# Patient Record
Sex: Female | Born: 1960 | Race: White | Hispanic: No | Marital: Married | Smoking: Current every day smoker
Health system: Southern US, Community
[De-identification: ages and names within clinical notes are randomized; demographics above are authoritative.]

## PROBLEM LIST (undated history)

## (undated) ENCOUNTER — Emergency Department (HOSPITAL_BASED_OUTPATIENT_CLINIC_OR_DEPARTMENT_OTHER): Payer: Medicare Other | Source: Home / Self Care | Attending: Emergency Medicine | Admitting: Emergency Medicine

## (undated) DIAGNOSIS — I219 Acute myocardial infarction, unspecified: Secondary | ICD-10-CM

## (undated) DIAGNOSIS — J449 Chronic obstructive pulmonary disease, unspecified: Secondary | ICD-10-CM

## (undated) DIAGNOSIS — J9611 Chronic respiratory failure with hypoxia: Secondary | ICD-10-CM

## (undated) DIAGNOSIS — I509 Heart failure, unspecified: Secondary | ICD-10-CM

## (undated) DIAGNOSIS — F1910 Other psychoactive substance abuse, uncomplicated: Secondary | ICD-10-CM

## (undated) DIAGNOSIS — I10 Essential (primary) hypertension: Secondary | ICD-10-CM

## (undated) DIAGNOSIS — I2510 Atherosclerotic heart disease of native coronary artery without angina pectoris: Secondary | ICD-10-CM

## (undated) DIAGNOSIS — K219 Gastro-esophageal reflux disease without esophagitis: Secondary | ICD-10-CM

## (undated) HISTORY — PX: ABDOMINAL HYSTERECTOMY: SHX81

## (undated) HISTORY — PX: CHOLECYSTECTOMY: SHX55

## (undated) HISTORY — PX: CARDIAC CATHETERIZATION: SHX172

## (undated) HISTORY — PX: HX HYSTERECTOMY: SHX81

---

## 1999-03-29 ENCOUNTER — Emergency Department (HOSPITAL_COMMUNITY): Admission: EM | Admit: 1999-03-29 | Discharge: 1999-03-30 | Payer: Self-pay | Admitting: Emergency Medicine

## 1999-05-17 ENCOUNTER — Emergency Department (HOSPITAL_COMMUNITY): Admission: EM | Admit: 1999-05-17 | Discharge: 1999-05-17 | Payer: Self-pay | Admitting: Emergency Medicine

## 1999-05-18 ENCOUNTER — Encounter: Payer: Self-pay | Admitting: Emergency Medicine

## 1999-05-18 ENCOUNTER — Ambulatory Visit (HOSPITAL_COMMUNITY): Admission: RE | Admit: 1999-05-18 | Discharge: 1999-05-18 | Payer: Self-pay | Admitting: Emergency Medicine

## 1999-07-16 ENCOUNTER — Encounter: Payer: Self-pay | Admitting: Emergency Medicine

## 1999-07-16 ENCOUNTER — Emergency Department (HOSPITAL_COMMUNITY): Admission: EM | Admit: 1999-07-16 | Discharge: 1999-07-16 | Payer: Self-pay | Admitting: Emergency Medicine

## 1999-09-05 ENCOUNTER — Observation Stay (HOSPITAL_COMMUNITY): Admission: RE | Admit: 1999-09-05 | Discharge: 1999-09-06 | Payer: Self-pay

## 1999-09-30 ENCOUNTER — Emergency Department (HOSPITAL_COMMUNITY): Payer: Self-pay

## 1999-10-14 ENCOUNTER — Emergency Department (HOSPITAL_COMMUNITY): Admission: EM | Admit: 1999-10-14 | Discharge: 1999-10-14 | Payer: Self-pay | Admitting: Emergency Medicine

## 1999-11-07 ENCOUNTER — Ambulatory Visit (HOSPITAL_COMMUNITY): Admission: RE | Admit: 1999-11-07 | Discharge: 1999-11-07 | Payer: Self-pay | Admitting: Gastroenterology

## 1999-12-31 ENCOUNTER — Emergency Department (HOSPITAL_COMMUNITY): Admission: EM | Admit: 1999-12-31 | Discharge: 1999-12-31 | Payer: Self-pay | Admitting: Emergency Medicine

## 1999-12-31 ENCOUNTER — Encounter: Payer: Self-pay | Admitting: Emergency Medicine

## 2000-01-18 ENCOUNTER — Emergency Department (HOSPITAL_COMMUNITY): Admission: EM | Admit: 2000-01-18 | Discharge: 2000-01-18 | Payer: Self-pay | Admitting: Emergency Medicine

## 2018-10-28 ENCOUNTER — Other Ambulatory Visit: Payer: Self-pay | Admitting: Family Medicine

## 2018-10-28 DIAGNOSIS — Z1231 Encounter for screening mammogram for malignant neoplasm of breast: Secondary | ICD-10-CM

## 2019-10-22 ENCOUNTER — Ambulatory Visit (HOSPITAL_COMMUNITY)
Admission: EM | Admit: 2019-10-22 | Discharge: 2019-10-22 | Disposition: A | Discharge status: Home / Self Care | Payer: Medicare Other | Attending: Urgent Care | Admitting: Urgent Care

## 2019-10-22 ENCOUNTER — Ambulatory Visit (INDEPENDENT_AMBULATORY_CARE_PROVIDER_SITE_OTHER): Payer: Medicare Other

## 2019-10-22 ENCOUNTER — Encounter (HOSPITAL_COMMUNITY): Payer: Self-pay

## 2019-10-22 ENCOUNTER — Other Ambulatory Visit: Payer: Self-pay

## 2019-10-22 DIAGNOSIS — R0902 Hypoxemia: Secondary | ICD-10-CM

## 2019-10-22 DIAGNOSIS — W19XXXA Unspecified fall, initial encounter: Secondary | ICD-10-CM

## 2019-10-22 DIAGNOSIS — M25562 Pain in left knee: Secondary | ICD-10-CM

## 2019-10-22 DIAGNOSIS — M25532 Pain in left wrist: Secondary | ICD-10-CM | POA: Diagnosis not present

## 2019-10-22 DIAGNOSIS — S8002XA Contusion of left knee, initial encounter: Secondary | ICD-10-CM | POA: Diagnosis not present

## 2019-10-22 DIAGNOSIS — S60212A Contusion of left wrist, initial encounter: Secondary | ICD-10-CM

## 2019-10-22 DIAGNOSIS — J449 Chronic obstructive pulmonary disease, unspecified: Secondary | ICD-10-CM

## 2019-10-22 MED ORDER — METHYLPREDNISOLONE SODIUM SUCC 125 MG IJ SOLR
INTRAMUSCULAR | Status: AC
  Filled 2019-10-22: qty 2

## 2019-10-22 MED ORDER — METHYLPREDNISOLONE SODIUM SUCC 125 MG IJ SOLR
125.0000 mg | Freq: Once | INTRAMUSCULAR | Status: AC
  Administered 2019-10-22: 125 mg via INTRAMUSCULAR

## 2019-10-22 NOTE — ED Triage Notes (Signed)
Pt. States a month ago she fell & recently she has been sore and weak in her arms, her left knee is swollen bad.

## 2019-10-22 NOTE — Discharge Instructions (Addendum)
Please make sure you schedule follow-up with your PCP as soon as possible for recheck on your COPD.  I would emphasize the consideration for at home oxygen for your COPD.  In the meantime please make sure that you schedule your nebulized albuterol with Atrovent every 4-6 hours.  If you develop fever, chest pain, worsening shortness of breath and persistent wheezing then please report to the emergency room as this could be a COPD exacerbation.

## 2019-10-22 NOTE — ED Provider Notes (Signed)
[name]   MRN: [id number] DOB: 05-28-1961  Subjective:   Kim Greene is a 58 y.o. female presenting for recheck on suffering a fall about 1 month ago.  This is her second check for the same fall, went to [place] about 2 weeks ago.  She did have x-ray done of the left wrist and was negative.  She was given tramadol which has helped very little.  She has continued to have progressive worsening left knee pain and swelling, difficulty bearing weight, persistent left wrist pain that is like a shock type pulsation.  She has also used a cream over her left wrist with very temporary relief.  Patient has a hx of COPD, uses albuterol nebulizer treatments daily. She does not have oxygen at home.  She has not checked back with her PCP in several months out of fear of COVID-19.  She has practice social distancing.  States that the nebulizer treatments help her but she does continue to wheeze on a daily basis, does not feel like her status is changed in the past few days.  Takes APAP, albuterol nebulized, Nexium.  Has a hx of COPD, GERD.    Family History  Problem Relation Age of Onset  . Diabetes Mother   . Hypertension Mother   . Stroke Mother   . Hyperlipidemia Mother   . Lung cancer Father     Social History   Tobacco Use  . Smoking status: Current Every Day Smoker    Types: Cigarettes  . Smokeless tobacco: Never Used  Substance Use Topics  . Alcohol use: Never    Frequency: Never  . Drug use: Never    ROS   Objective:   Vitals: BP (!) 157/83 (BP Location: Left Arm)   Pulse 90   Temp 98.8 F (37.1 C) (Oral)   Resp 17   Wt 152 lb (68.9 kg)   SpO2 (!) 89%   Oxygen saturation increased to 95% on 4L of oxygen.   Physical Exam Constitutional:      General: She is not in acute distress.    Appearance: Normal appearance. She is well-developed. She is not ill-appearing, toxic-appearing or diaphoretic.  HENT:     Head: Normocephalic and atraumatic.   Nose: Nose normal.     Mouth/Throat:     Mouth: Mucous membranes are moist.  Eyes:     Extraocular Movements: Extraocular movements intact.     Pupils: Pupils are equal, round, and reactive to light.  Cardiovascular:     Rate and Rhythm: Normal rate and regular rhythm.     Pulses: Normal pulses.     Heart sounds: Normal heart sounds. No murmur. No friction rub. No gallop.   Pulmonary:     Effort: Pulmonary effort is normal. No respiratory distress.     Breath sounds: No stridor. Wheezing (mid-lower lung fields) present. No rhonchi or rales.     Comments: Patient is not having respiratory distress, no breathing through pursed lips or use of accessory muscles. Musculoskeletal:     Left wrist: She exhibits decreased range of motion, tenderness (over area depicted) and swelling (trace). She exhibits no bony tenderness, no effusion, no crepitus, no deformity and no laceration.     Left knee: She exhibits decreased range of motion, swelling (trace) and bony tenderness. She exhibits no ecchymosis, no deformity, no laceration, no erythema and normal patellar mobility. Tenderness found. Medial joint line, lateral joint line and patellar tendon tenderness noted.  Arms:  Skin:    General: Skin is warm and dry.     Findings: No rash.  Neurological:     Mental Status: She is alert and oriented to person, place, and time.  Psychiatric:        Mood and Affect: Mood normal.        Behavior: Behavior normal.        Thought Content: Thought content normal.        Judgment: Judgment normal.     Dg Knee Complete 4 Views Left  Result Date: 10/22/2019 CLINICAL DATA:  Fall 1 month ago with left knee pain. EXAM: LEFT KNEE - COMPLETE 4+ VIEW COMPARISON:  None. FINDINGS: No evidence of fracture, dislocation, or joint effusion. No evidence of arthropathy or other focal bone abnormality. Soft tissues are unremarkable. IMPRESSION: Negative. Electronically Signed   By: Elberta Fortis M.D.   On: 10/22/2019  16:11     Assessment and Plan :   1. Contusion of left knee, initial encounter   2. Fall, initial encounter   3. Hypoxemia   4. Left wrist pain   5. Acute pain of left knee   6. Chronic obstructive pulmonary disease, unspecified COPD type (HCC)   7. Contusion of left wrist, initial encounter     We will manage for contusion of her left knee but also counseled patient that there is possible soft tissue injury including meniscus, ligaments.  Recommended that she set up a follow-up soon as possible with her PCP as she may end up needing an MRI should the steroid injection not help.  I offered to apply Ace wrap to patient but she refused this, states that she will do it on her own at home.  I expect the steroid injection will help her breathing as well, emphasized need for urgent follow-up with her PCP to revisit her COPD management.  Strict ER precautions. Counseled patient on potential for adverse effects with medications prescribed today, patient verbalized understanding.    Wallis Bamberg, PA-C 10/22/19 1625

## 2021-06-11 ENCOUNTER — Inpatient Hospital Stay (HOSPITAL_COMMUNITY)
Admission: EM | Admit: 2021-06-11 | Discharge: 2021-06-16 | DRG: 177 | Disposition: A | Discharge status: Home Health | Payer: Medicare Other | Attending: Internal Medicine | Admitting: Internal Medicine

## 2021-06-11 ENCOUNTER — Emergency Department (HOSPITAL_COMMUNITY): Payer: Medicare Other

## 2021-06-11 ENCOUNTER — Encounter (HOSPITAL_COMMUNITY): Payer: Self-pay

## 2021-06-11 DIAGNOSIS — Z823 Family history of stroke: Secondary | ICD-10-CM

## 2021-06-11 DIAGNOSIS — E785 Hyperlipidemia, unspecified: Secondary | ICD-10-CM | POA: Diagnosis present

## 2021-06-11 DIAGNOSIS — F32A Depression, unspecified: Secondary | ICD-10-CM | POA: Diagnosis present

## 2021-06-11 DIAGNOSIS — D7589 Other specified diseases of blood and blood-forming organs: Secondary | ICD-10-CM

## 2021-06-11 DIAGNOSIS — K219 Gastro-esophageal reflux disease without esophagitis: Secondary | ICD-10-CM | POA: Diagnosis present

## 2021-06-11 DIAGNOSIS — Z8249 Family history of ischemic heart disease and other diseases of the circulatory system: Secondary | ICD-10-CM

## 2021-06-11 DIAGNOSIS — R52 Pain, unspecified: Secondary | ICD-10-CM

## 2021-06-11 DIAGNOSIS — Z7901 Long term (current) use of anticoagulants: Secondary | ICD-10-CM

## 2021-06-11 DIAGNOSIS — Z833 Family history of diabetes mellitus: Secondary | ICD-10-CM

## 2021-06-11 DIAGNOSIS — E876 Hypokalemia: Secondary | ICD-10-CM

## 2021-06-11 DIAGNOSIS — S301XXA Contusion of abdominal wall, initial encounter: Secondary | ICD-10-CM | POA: Diagnosis present

## 2021-06-11 DIAGNOSIS — U071 COVID-19: Principal | ICD-10-CM

## 2021-06-11 DIAGNOSIS — J1282 Pneumonia due to coronavirus disease 2019: Secondary | ICD-10-CM

## 2021-06-11 DIAGNOSIS — Z66 Do not resuscitate: Secondary | ICD-10-CM | POA: Diagnosis present

## 2021-06-11 DIAGNOSIS — M7989 Other specified soft tissue disorders: Secondary | ICD-10-CM

## 2021-06-11 DIAGNOSIS — Z801 Family history of malignant neoplasm of trachea, bronchus and lung: Secondary | ICD-10-CM

## 2021-06-11 DIAGNOSIS — G8929 Other chronic pain: Secondary | ICD-10-CM | POA: Diagnosis present

## 2021-06-11 DIAGNOSIS — J449 Chronic obstructive pulmonary disease, unspecified: Secondary | ICD-10-CM | POA: Diagnosis present

## 2021-06-11 DIAGNOSIS — T148XXA Other injury of unspecified body region, initial encounter: Secondary | ICD-10-CM

## 2021-06-11 DIAGNOSIS — F17210 Nicotine dependence, cigarettes, uncomplicated: Secondary | ICD-10-CM | POA: Diagnosis present

## 2021-06-11 DIAGNOSIS — G9349 Other encephalopathy: Secondary | ICD-10-CM | POA: Diagnosis present

## 2021-06-11 DIAGNOSIS — F909 Attention-deficit hyperactivity disorder, unspecified type: Secondary | ICD-10-CM | POA: Diagnosis present

## 2021-06-11 DIAGNOSIS — J9621 Acute and chronic respiratory failure with hypoxia: Secondary | ICD-10-CM | POA: Diagnosis present

## 2021-06-11 DIAGNOSIS — S8012XA Contusion of left lower leg, initial encounter: Secondary | ICD-10-CM | POA: Diagnosis present

## 2021-06-11 DIAGNOSIS — Z83438 Family history of other disorder of lipoprotein metabolism and other lipidemia: Secondary | ICD-10-CM

## 2021-06-11 DIAGNOSIS — Z7983 Long term (current) use of bisphosphonates: Secondary | ICD-10-CM

## 2021-06-11 DIAGNOSIS — Z9981 Dependence on supplemental oxygen: Secondary | ICD-10-CM

## 2021-06-11 DIAGNOSIS — Z79899 Other long term (current) drug therapy: Secondary | ICD-10-CM

## 2021-06-11 DIAGNOSIS — I2510 Atherosclerotic heart disease of native coronary artery without angina pectoris: Secondary | ICD-10-CM

## 2021-06-11 DIAGNOSIS — R55 Syncope and collapse: Secondary | ICD-10-CM | POA: Diagnosis present

## 2021-06-11 DIAGNOSIS — R0902 Hypoxemia: Secondary | ICD-10-CM

## 2021-06-11 DIAGNOSIS — J440 Chronic obstructive pulmonary disease with acute lower respiratory infection: Secondary | ICD-10-CM | POA: Diagnosis present

## 2021-06-11 DIAGNOSIS — Z9119 Patient's noncompliance with other medical treatment and regimen: Secondary | ICD-10-CM

## 2021-06-11 HISTORY — DX: Atherosclerotic heart disease of native coronary artery without angina pectoris: I25.10

## 2021-06-11 HISTORY — DX: Gastro-esophageal reflux disease without esophagitis: K21.9

## 2021-06-11 LAB — CBG MONITORING, ED: Glucose-Capillary: 90 mg/dL (ref 70–99)

## 2021-06-11 NOTE — ED Provider Notes (Signed)
Emergency Medicine Provider Triage Evaluation Note  Kim Greene , a 60 y.o. female  was evaluated in triage.  Pt brought to check-in desk by son who reports she is having a stroke.  He is no longer in the lobby for me to discuss with.  Patient unable to tell me why she is here.  She gives an incorrect name, she does not know where she is or what the date is.  She does give me a correct birthdate.  Level 5 caveat due to altered mental status.  Discussed with son who states that she awoke this morning as normal but has become progressively confused throughout the day.  She does take gabapentin, Lyrica and Suboxone.  He denies possibility of accidental overdose.  Denies any alcohol usage today.  Reports she did have a heart attack 2 weeks ago and was treated in [place].  Denies falls or known trauma.  She is on a blood thinner.  Also concerned about bruising to her abdomen and left leg.  Review of Systems  Positive: Unable to obtain due to confusion Negative: Unable to obtain due to confusion  Physical Exam  BP 125/72 (BP Location: Right Arm)   Pulse 88   Resp 16   SpO2 (!) 78%  Gen:   Awake, no distress, appears sleepy Resp:  Normal effort, congested cough, rales MSK:   Moves extremities without difficulty  Other:  confused  Medical Decision Making  Medically screening exam initiated at 1:24 AM.  Appropriate orders placed.  Kim Greene was informed that the remainder of the evaluation will be completed by another provider, this initial triage assessment does not replace that evaluation, and the importance of remaining in the ED until their evaluation is complete.  Altered mental status of unknown origin.   1:25 AM Vitals just now placed into chart after multiple requests.  Pt profoundly hypoxic - likely the cause of her AMS.    Rechecked by myself - 74% on room air.  Pt placed on O2 and will be moved to the next room.   Kim Greene, Kim Greene 06/12/21 0129     Kim Octave, MD 06/12/21 [phone]

## 2021-06-11 NOTE — ED Triage Notes (Signed)
Pt comes in for AMS, does not know who brought her here or why she is here, no neuro deficits.

## 2021-06-12 ENCOUNTER — Emergency Department (HOSPITAL_COMMUNITY): Payer: Medicare Other

## 2021-06-12 ENCOUNTER — Inpatient Hospital Stay (HOSPITAL_COMMUNITY): Payer: Medicare Other

## 2021-06-12 ENCOUNTER — Encounter (HOSPITAL_COMMUNITY): Payer: Self-pay | Admitting: Internal Medicine

## 2021-06-12 ENCOUNTER — Encounter (HOSPITAL_COMMUNITY): Payer: Medicare Other

## 2021-06-12 DIAGNOSIS — M7989 Other specified soft tissue disorders: Secondary | ICD-10-CM | POA: Diagnosis not present

## 2021-06-12 DIAGNOSIS — S301XXA Contusion of abdominal wall, initial encounter: Secondary | ICD-10-CM | POA: Diagnosis present

## 2021-06-12 DIAGNOSIS — G9349 Other encephalopathy: Secondary | ICD-10-CM | POA: Diagnosis present

## 2021-06-12 DIAGNOSIS — R55 Syncope and collapse: Secondary | ICD-10-CM | POA: Diagnosis present

## 2021-06-12 DIAGNOSIS — J9601 Acute respiratory failure with hypoxia: Secondary | ICD-10-CM

## 2021-06-12 DIAGNOSIS — F32A Depression, unspecified: Secondary | ICD-10-CM | POA: Diagnosis present

## 2021-06-12 DIAGNOSIS — E785 Hyperlipidemia, unspecified: Secondary | ICD-10-CM | POA: Diagnosis present

## 2021-06-12 DIAGNOSIS — J449 Chronic obstructive pulmonary disease, unspecified: Secondary | ICD-10-CM | POA: Diagnosis present

## 2021-06-12 DIAGNOSIS — Z801 Family history of malignant neoplasm of trachea, bronchus and lung: Secondary | ICD-10-CM | POA: Diagnosis not present

## 2021-06-12 DIAGNOSIS — Z8249 Family history of ischemic heart disease and other diseases of the circulatory system: Secondary | ICD-10-CM | POA: Diagnosis not present

## 2021-06-12 DIAGNOSIS — Z833 Family history of diabetes mellitus: Secondary | ICD-10-CM | POA: Diagnosis not present

## 2021-06-12 DIAGNOSIS — Z7901 Long term (current) use of anticoagulants: Secondary | ICD-10-CM | POA: Diagnosis not present

## 2021-06-12 DIAGNOSIS — Z9119 Patient's noncompliance with other medical treatment and regimen: Secondary | ICD-10-CM | POA: Diagnosis not present

## 2021-06-12 DIAGNOSIS — E876 Hypokalemia: Secondary | ICD-10-CM

## 2021-06-12 DIAGNOSIS — F17210 Nicotine dependence, cigarettes, uncomplicated: Secondary | ICD-10-CM | POA: Diagnosis present

## 2021-06-12 DIAGNOSIS — D7589 Other specified diseases of blood and blood-forming organs: Secondary | ICD-10-CM

## 2021-06-12 DIAGNOSIS — Z83438 Family history of other disorder of lipoprotein metabolism and other lipidemia: Secondary | ICD-10-CM | POA: Diagnosis not present

## 2021-06-12 DIAGNOSIS — K219 Gastro-esophageal reflux disease without esophagitis: Secondary | ICD-10-CM | POA: Diagnosis present

## 2021-06-12 DIAGNOSIS — I2510 Atherosclerotic heart disease of native coronary artery without angina pectoris: Secondary | ICD-10-CM

## 2021-06-12 DIAGNOSIS — Z7983 Long term (current) use of bisphosphonates: Secondary | ICD-10-CM | POA: Diagnosis not present

## 2021-06-12 DIAGNOSIS — Z823 Family history of stroke: Secondary | ICD-10-CM | POA: Diagnosis not present

## 2021-06-12 DIAGNOSIS — J9621 Acute and chronic respiratory failure with hypoxia: Secondary | ICD-10-CM | POA: Diagnosis present

## 2021-06-12 DIAGNOSIS — U071 COVID-19: Secondary | ICD-10-CM

## 2021-06-12 DIAGNOSIS — Z9981 Dependence on supplemental oxygen: Secondary | ICD-10-CM | POA: Diagnosis not present

## 2021-06-12 DIAGNOSIS — J1282 Pneumonia due to coronavirus disease 2019: Secondary | ICD-10-CM

## 2021-06-12 DIAGNOSIS — Z66 Do not resuscitate: Secondary | ICD-10-CM | POA: Diagnosis present

## 2021-06-12 DIAGNOSIS — J440 Chronic obstructive pulmonary disease with acute lower respiratory infection: Secondary | ICD-10-CM | POA: Diagnosis present

## 2021-06-12 LAB — CBC WITH DIFFERENTIAL/PLATELET
Abs Immature Granulocytes: 0.05 10*3/uL (ref 0.00–0.07)
Basophils Absolute: 0 10*3/uL (ref 0.0–0.1)
Basophils Relative: 0 %
Eosinophils Absolute: 0.1 10*3/uL (ref 0.0–0.5)
Eosinophils Relative: 2 %
HCT: 43 % (ref 36.0–46.0)
Hemoglobin: 14 g/dL (ref 12.0–15.0)
Immature Granulocytes: 1 %
Lymphocytes Relative: 12 %
Lymphs Abs: 1 10*3/uL (ref 0.7–4.0)
MCH: 33.6 pg (ref 26.0–34.0)
MCHC: 32.6 g/dL (ref 30.0–36.0)
MCV: 103.1 fL — ABNORMAL HIGH (ref 80.0–100.0)
Monocytes Absolute: 0.8 10*3/uL (ref 0.1–1.0)
Monocytes Relative: 10 %
Neutro Abs: 6 10*3/uL (ref 1.7–7.7)
Neutrophils Relative %: 75 %
Platelets: 126 10*3/uL — ABNORMAL LOW (ref 150–400)
RBC: 4.17 MIL/uL (ref 3.87–5.11)
RDW: 13.8 % (ref 11.5–15.5)
WBC: 8 10*3/uL (ref 4.0–10.5)
nRBC: 0 % (ref 0.0–0.2)

## 2021-06-12 LAB — URINALYSIS, COMPLETE (UACMP) WITH MICROSCOPIC
Bilirubin Urine: NEGATIVE
Glucose, UA: NEGATIVE mg/dL
Hgb urine dipstick: NEGATIVE
Ketones, ur: NEGATIVE mg/dL
Leukocytes,Ua: NEGATIVE
Nitrite: NEGATIVE
Protein, ur: 100 mg/dL — AB
Specific Gravity, Urine: 1.018 (ref 1.005–1.030)
pH: 7 (ref 5.0–8.0)

## 2021-06-12 LAB — I-STAT ARTERIAL BLOOD GAS, ED
Acid-Base Excess: 9 mmol/L — ABNORMAL HIGH (ref 0.0–2.0)
Bicarbonate: 34.2 mmol/L — ABNORMAL HIGH (ref 20.0–28.0)
Calcium, Ion: 0.94 mmol/L — ABNORMAL LOW (ref 1.15–1.40)
HCT: 38 % (ref 36.0–46.0)
Hemoglobin: 12.9 g/dL (ref 12.0–15.0)
O2 Saturation: 100 %
Patient temperature: 101.7
Potassium: 3.4 mmol/L — ABNORMAL LOW (ref 3.5–5.1)
Sodium: 136 mmol/L (ref 135–145)
TCO2: 36 mmol/L — ABNORMAL HIGH (ref 22–32)
pCO2 arterial: 52.7 mmHg — ABNORMAL HIGH (ref 32.0–48.0)
pH, Arterial: 7.427 (ref 7.350–7.450)
pO2, Arterial: 293 mmHg — ABNORMAL HIGH (ref 83.0–108.0)

## 2021-06-12 LAB — PROCALCITONIN: Procalcitonin: 0.2 ng/mL

## 2021-06-12 LAB — COMPREHENSIVE METABOLIC PANEL
ALT: 17 U/L (ref 0–44)
AST: 18 U/L (ref 15–41)
Albumin: 3.1 g/dL — ABNORMAL LOW (ref 3.5–5.0)
Alkaline Phosphatase: 73 U/L (ref 38–126)
Anion gap: 10 (ref 5–15)
BUN: 8 mg/dL (ref 6–20)
CO2: 29 mmol/L (ref 22–32)
Calcium: 7.2 mg/dL — ABNORMAL LOW (ref 8.9–10.3)
Chloride: 98 mmol/L (ref 98–111)
Creatinine, Ser: 0.82 mg/dL (ref 0.44–1.00)
GFR, Estimated: >60 mL/min (ref >60)
Glucose, Bld: 90 mg/dL (ref 70–99)
Potassium: 3.5 mmol/L (ref 3.5–5.1)
Sodium: 137 mmol/L (ref 135–145)
Total Bilirubin: 1 mg/dL (ref 0.3–1.2)
Total Protein: 6.4 g/dL — ABNORMAL LOW (ref 6.5–8.1)

## 2021-06-12 LAB — RAPID URINE DRUG SCREEN, HOSP PERFORMED
Amphetamines: POSITIVE — AB
Barbiturates: POSITIVE — AB
Benzodiazepines: POSITIVE — AB
Cocaine: NOT DETECTED
Opiates: NOT DETECTED
Tetrahydrocannabinol: NOT DETECTED

## 2021-06-12 LAB — CREATININE, SERUM
Creatinine, Ser: 0.71 mg/dL (ref 0.44–1.00)
GFR, Estimated: >60 mL/min (ref >60)

## 2021-06-12 LAB — C-REACTIVE PROTEIN: CRP: 13.6 mg/dL — ABNORMAL HIGH (ref <1.0)

## 2021-06-12 LAB — IRON AND TIBC
Iron: 19 ug/dL — ABNORMAL LOW (ref 28–170)
Saturation Ratios: 6 % — ABNORMAL LOW (ref 10.4–31.8)
TIBC: 305 ug/dL (ref 250–450)
UIBC: 286 ug/dL

## 2021-06-12 LAB — TROPONIN I (HIGH SENSITIVITY)
Troponin I (High Sensitivity): 20 ng/L — ABNORMAL HIGH (ref <18)
Troponin I (High Sensitivity): 10 ng/L (ref <18)

## 2021-06-12 LAB — FOLATE: Folate: 8.5 ng/mL (ref >5.9)

## 2021-06-12 LAB — CBC
HCT: 38.5 % (ref 36.0–46.0)
Hemoglobin: 12.8 g/dL (ref 12.0–15.0)
MCH: 34 pg (ref 26.0–34.0)
MCHC: 33.2 g/dL (ref 30.0–36.0)
MCV: 102.1 fL — ABNORMAL HIGH (ref 80.0–100.0)
Platelets: 113 10*3/uL — ABNORMAL LOW (ref 150–400)
RBC: 3.77 MIL/uL — ABNORMAL LOW (ref 3.87–5.11)
RDW: 13.7 % (ref 11.5–15.5)
WBC: 8.7 10*3/uL (ref 4.0–10.5)
nRBC: 0 % (ref 0.0–0.2)

## 2021-06-12 LAB — FERRITIN: Ferritin: 106 ng/mL (ref 11–307)

## 2021-06-12 LAB — BRAIN NATRIURETIC PEPTIDE: B Natriuretic Peptide: 93.9 pg/mL (ref 0.0–100.0)

## 2021-06-12 LAB — D-DIMER, QUANTITATIVE: D-Dimer, Quant: 1.62 ug/mL-FEU — ABNORMAL HIGH (ref 0.00–0.50)

## 2021-06-12 LAB — ETHANOL: Alcohol, Ethyl (B): <10 mg/dL (ref <10)

## 2021-06-12 LAB — HIV ANTIBODY (ROUTINE TESTING W REFLEX): HIV Screen 4th Generation wRfx: NONREACTIVE

## 2021-06-12 LAB — LACTIC ACID, PLASMA: Lactic Acid, Venous: 1.2 mmol/L (ref 0.5–1.9)

## 2021-06-12 LAB — RESP PANEL BY RT-PCR (FLU A&B, COVID) ARPGX2
Influenza A by PCR: NEGATIVE
Influenza B by PCR: NEGATIVE
SARS Coronavirus 2 by RT PCR: POSITIVE — AB

## 2021-06-12 LAB — VITAMIN B12: Vitamin B-12: 86 pg/mL — ABNORMAL LOW (ref 180–914)

## 2021-06-12 LAB — AMMONIA: Ammonia: 33 umol/L (ref 9–35)

## 2021-06-12 MED ORDER — ENOXAPARIN SODIUM 40 MG/0.4ML IJ SOSY
40.0000 mg | PREFILLED_SYRINGE | INTRAMUSCULAR | Status: DC
  Administered 2021-06-12: 40 mg via SUBCUTANEOUS
  Filled 2021-06-12: qty 0.4

## 2021-06-12 MED ORDER — FLUTICASONE FUROATE-VILANTEROL 100-25 MCG/INH IN AEPB
1.0000 | INHALATION_SPRAY | Freq: Every day | RESPIRATORY_TRACT | Status: DC
  Administered 2021-06-12 – 2021-06-16 (×5): 1 via RESPIRATORY_TRACT
  Filled 2021-06-12: qty 28

## 2021-06-12 MED ORDER — DEXAMETHASONE SODIUM PHOSPHATE 10 MG/ML IJ SOLN
6.0000 mg | Freq: Once | INTRAMUSCULAR | Status: AC
  Administered 2021-06-12: 6 mg via INTRAVENOUS
  Filled 2021-06-12: qty 1

## 2021-06-12 MED ORDER — ACETAMINOPHEN 325 MG PO TABS: 650.0000 mg | ORAL_TABLET | Freq: Four times a day (QID) | ORAL | Status: DC | PRN

## 2021-06-12 MED ORDER — PREGABALIN 100 MG PO CAPS
200.0000 mg | ORAL_CAPSULE | Freq: Three times a day (TID) | ORAL | Status: DC
  Administered 2021-06-12 – 2021-06-16 (×13): 200 mg via ORAL
  Filled 2021-06-12 (×13): qty 2

## 2021-06-12 MED ORDER — SODIUM CHLORIDE 0.9 % IV SOLN
200.0000 mg | Freq: Once | INTRAVENOUS | Status: AC
  Administered 2021-06-12: 200 mg via INTRAVENOUS
  Filled 2021-06-12: qty 40

## 2021-06-12 MED ORDER — FLUTICASONE-UMECLIDIN-VILANT 100-62.5-25 MCG/INH IN AEPB: 1.0000 | INHALATION_SPRAY | Freq: Every day | RESPIRATORY_TRACT | Status: DC

## 2021-06-12 MED ORDER — CLOPIDOGREL BISULFATE 75 MG PO TABS
75.0000 mg | ORAL_TABLET | Freq: Every day | ORAL | Status: DC
  Administered 2021-06-12 – 2021-06-16 (×5): 75 mg via ORAL
  Filled 2021-06-12 (×6): qty 1

## 2021-06-12 MED ORDER — ATORVASTATIN CALCIUM 40 MG PO TABS
40.0000 mg | ORAL_TABLET | Freq: Every day | ORAL | Status: DC
  Administered 2021-06-12 – 2021-06-15 (×4): 40 mg via ORAL
  Filled 2021-06-12 (×4): qty 1

## 2021-06-12 MED ORDER — SODIUM CHLORIDE 0.9 % IV SOLN
100.0000 mg | Freq: Every day | INTRAVENOUS | Status: AC
Start: 2021-06-13 — End: 2021-06-16
  Administered 2021-06-13 – 2021-06-16 (×4): 100 mg via INTRAVENOUS
  Filled 2021-06-12 (×4): qty 20

## 2021-06-12 MED ORDER — METHYLPREDNISOLONE SODIUM SUCC 125 MG IJ SOLR
60.0000 mg | Freq: Two times a day (BID) | INTRAMUSCULAR | Status: DC
  Administered 2021-06-12 – 2021-06-16 (×9): 60 mg via INTRAVENOUS
  Filled 2021-06-12 (×9): qty 2

## 2021-06-12 MED ORDER — DEXAMETHASONE SODIUM PHOSPHATE 10 MG/ML IJ SOLN
6.0000 mg | INTRAMUSCULAR | Status: DC
Start: 2021-06-13 — End: 2021-06-12

## 2021-06-12 MED ORDER — ACETAMINOPHEN 650 MG RE SUPP: 650.0000 mg | Freq: Four times a day (QID) | RECTAL | Status: DC | PRN

## 2021-06-12 MED ORDER — ASPIRIN EC 81 MG PO TBEC
81.0000 mg | DELAYED_RELEASE_TABLET | Freq: Every day | ORAL | Status: DC
  Administered 2021-06-12 – 2021-06-16 (×5): 81 mg via ORAL
  Filled 2021-06-12 (×5): qty 1

## 2021-06-12 MED ORDER — UMECLIDINIUM BROMIDE 62.5 MCG/INH IN AEPB
1.0000 | INHALATION_SPRAY | Freq: Every day | RESPIRATORY_TRACT | Status: DC
  Administered 2021-06-12 – 2021-06-16 (×5): 1 via RESPIRATORY_TRACT
  Filled 2021-06-12: qty 7

## 2021-06-12 NOTE — Plan of Care (Signed)
None

## 2021-06-12 NOTE — ED Notes (Signed)
Pt continues to remove monitoring and oxygen. Pt allowed this RN to place pulse ox and oxygen back on pt.

## 2021-06-12 NOTE — Assessment & Plan Note (Signed)
-   On 2 L chronically at home but has been noncompliant recently - Continues to smoke, 1-2 PPD/day -Continue O2 and wean as able -Acute worsening considered due to underlying COVID infection as well as probable COPD exacerbation

## 2021-06-12 NOTE — Assessment & Plan Note (Signed)
continue lipitor ?

## 2021-06-12 NOTE — Progress Notes (Signed)
Progress Note    Kim [facility]   ZOX:[id number]  DOB: 1961-10-26  DOA: 06/11/2021     0  PCP: Patient, No Pcp Per (Inactive)  CC: SOB, fever, cough  [facility] Course: [facility] is a 60 yo female with PMH COPD, chronic pain and also on Suboxone was [facility] at [facility] [place] what looks like could be an NSTEMI when patient underwent cardiac cath and was told to take aspirin Plavix and statins which are new at the time patient also was placed on Z-Pak and steroids was discharged about 4 days PTA, she had come to [facility] to help her daughter to move back to her CT.   Evening prior to admission, she went with her son for shopping at [employer] patient appeared confused and was having some strange smell and later on passed out on the floor.  She did regain consciousness soon but was brought to the ER.  Did not complain of any chest pain but did have shortness of breath.   After her discharge from the [facility] 4 days ago she was found to have bruising on the left lower extremity and left lower quadrant of the abdomen.  Denies any abdominal pain.  Patient usually takes Vyvanse but [facility] doctor at discharge advised her to stop taking Vyvanse due to her cardiac issues she had at the [facility].   ED Course: In the ER patient was confused initially requiring 100% nonrebreather presently on 5 L.  Chest x-ray shows bilateral infiltrates COVID test came back positive patient also was febrile with temperature of 101.7.  CT head is unremarkable.  EKG shows normal sinus rhythm with nonspecific ST-T changes.  Patient is started on Decadron remdesivir admitted for acute hypoxic respiratory failure secondary to COVID infection. She confirmed with her son present in the ER that she wished to be a DNR/DNI.  Interval History:  Seen in the ER this morning.  Her son was present bedside. We discussed CODE STATUS and she elected for DNR/DNI as  well.   ROS: Constitutional: positive for fatigue, fevers, and malaise, negative for night sweats, Respiratory: positive for cough, dyspnea on exertion, sputum, and wheezing, negative for pleurisy/chest pain, Cardiovascular: negative for chest pain, and Gastrointestinal: negative for abdominal pain  Assessment & Plan: * Pneumonia due to COVID-19 virus - At risk for further decompensation with underlying newly diagnosed CAD.  Awaiting records from [facility] -Continue remdesivir and steroids - CODE STATUS confirmed in the ER, she elects for DNR/DNI, her son was bedside as witness for this conversation -Change Decadron to Solu-Medrol - Check inflammatory markers daily - Encourage incentive spirometer, flutter, and self proning  Acute on chronic respiratory failure with hypoxia (HCC) - On 2 L chronically at home but has been noncompliant recently - Continues to smoke, 1-2 PPD/day -Continue O2 and wean as able -Acute worsening considered due to underlying COVID infection as well as probable COPD exacerbation  CAD (coronary artery disease) - Records requested from [facility] where she was there recently for possible NSTEMI - Continue aspirin, Plavix, statin  Macrocytosis without anemia - Check iron studies and folate, B12  Hypokalemia - Will replete and recheck as needed  COPD (chronic obstructive pulmonary disease) (HCC) - continue steroids and breathing treatments   HLD (hyperlipidemia) - continue lipitor     Old records reviewed in assessment of this patient  Antimicrobials: Remdesivir 7/26 >> current  DVT prophylaxis: enoxaparin (LOVENOX) injection 40 mg Start: 06/12/21 [id number]  Code Status:   Code Status: DNR Family Communication: son  Disposition Plan: Status is: Inpatient  Remains inpatient appropriate because:IV treatments appropriate due to intensity of illness or inability to take PO and Inpatient level of care appropriate due to severity of  illness  Dispo: The patient is from: Home              Anticipated d/c is to: Home              Patient currently is not medically stable to d/c.   Difficult to place patient No  Risk of unplanned readmission score: Unplanned Admission- Pilot do not use: 12.84   Objective: Blood pressure 111/74, pulse 61, temperature 99.2 F (37.3 C), temperature source Temporal, resp. rate 16, SpO2 92 %.  Examination: General appearance: alert, cooperative, fatigued, and no distress Head: Normocephalic, without obvious abnormality, atraumatic Eyes:  EOMI Lungs:  Decreased air movement bilaterally, expiratory wheezing bilaterally, scattered rhonchi Heart: regular rate and rhythm and S1, S2 normal Abdomen: Lower abdominal bruising appreciated approximately 10 cm long by 5 cm wide.  Bowel sounds present, soft, nontender Extremities:  Bruising noted throughout entire left leg up to knee with associated 1+ edema Skin:  Bruising noted on abdomen and left leg Neurologic: Grossly normal  Consultants:    Procedures:    Data Reviewed: I have personally reviewed following labs and imaging studies Results for orders placed or performed during the [facility] encounter of 06/11/21 (from the past 24 hour(s))  CBG monitoring, ED     Status: None   Collection Time: 06/11/21 11:47 PM  Result Value Ref Range   Glucose-Capillary 90 70 - 99 mg/dL  Comprehensive metabolic panel     Status: Abnormal   Collection Time: 06/12/21 12:03 AM  Result Value Ref Range   Sodium 137 135 - 145 mmol/L   Potassium 3.5 3.5 - 5.1 mmol/L   Chloride 98 98 - 111 mmol/L   CO2 29 22 - 32 mmol/L   Glucose, Bld 90 70 - 99 mg/dL   BUN 8 6 - 20 mg/dL   Creatinine, Ser 6.31 0.44 - 1.00 mg/dL   Calcium 7.2 (L) 8.9 - 10.3 mg/dL   Total Protein 6.4 (L) 6.5 - 8.1 g/dL   Albumin 3.1 (L) 3.5 - 5.0 g/dL   AST 18 15 - 41 U/L   ALT 17 0 - 44 U/L   Alkaline Phosphatase 73 38 - 126 U/L   Total Bilirubin 1.0 0.3 - 1.2 mg/dL   GFR, Estimated  >49 >70 mL/min   Anion gap 10 5 - 15  CBC WITH DIFFERENTIAL     Status: Abnormal   Collection Time: 06/12/21 12:03 AM  Result Value Ref Range   WBC 8.0 4.0 - 10.5 K/uL   RBC 4.17 3.87 - 5.11 MIL/uL   Hemoglobin 14.0 12.0 - 15.0 g/dL   HCT 26.3 78.5 - 88.5 %   MCV 103.1 (H) 80.0 - 100.0 fL   MCH 33.6 26.0 - 34.0 pg   MCHC 32.6 30.0 - 36.0 g/dL   RDW 02.7 74.1 - 28.7 %   Platelets 126 (L) 150 - 400 K/uL   nRBC 0.0 0.0 - 0.2 %   Neutrophils Relative % 75 %   Neutro Abs 6.0 1.7 - 7.7 K/uL   Lymphocytes Relative 12 %   Lymphs Abs 1.0 0.7 - 4.0 K/uL   Monocytes Relative 10 %   Monocytes Absolute 0.8 0.1 - 1.0 K/uL   Eosinophils Relative 2 %  Eosinophils Absolute 0.1 0.0 - 0.5 K/uL   Basophils Relative 0 %   Basophils Absolute 0.0 0.0 - 0.1 K/uL   Immature Granulocytes 1 %   Abs Immature Granulocytes 0.05 0.00 - 0.07 K/uL  Ammonia     Status: None   Collection Time: 06/12/21 12:03 AM  Result Value Ref Range   Ammonia 33 9 - 35 umol/L  Lactic acid, plasma     Status: None   Collection Time: 06/12/21 12:03 AM  Result Value Ref Range   Lactic Acid, Venous 1.2 0.5 - 1.9 mmol/L  Ethanol     Status: None   Collection Time: 06/12/21 12:04 AM  Result Value Ref Range   Alcohol, Ethyl (B) <10 <10 mg/dL  Brain natriuretic peptide     Status: None   Collection Time: 06/12/21 12:40 AM  Result Value Ref Range   B Natriuretic Peptide 93.9 0.0 - 100.0 pg/mL  I-Stat arterial blood gas, ED     Status: Abnormal   Collection Time: 06/12/21  2:09 AM  Result Value Ref Range   pH, Arterial 7.427 7.350 - 7.450   pCO2 arterial 52.7 (H) 32.0 - 48.0 mmHg   pO2, Arterial 293 (H) 83.0 - 108.0 mmHg   Bicarbonate 34.2 (H) 20.0 - 28.0 mmol/L   TCO2 36 (H) 22 - 32 mmol/L   O2 Saturation 100.0 %   Acid-Base Excess 9.0 (H) 0.0 - 2.0 mmol/L   Sodium 136 135 - 145 mmol/L   Potassium 3.4 (L) 3.5 - 5.1 mmol/L   Calcium, Ion 0.94 (L) 1.15 - 1.40 mmol/L   HCT 38.0 36.0 - 46.0 %   Hemoglobin 12.9 12.0 -  15.0 g/dL   Patient temperature 867.5 F    Collection site Radial    Drawn by RT    Sample type ARTERIAL   Resp Panel by RT-PCR (Flu A&B, Covid) Nasopharyngeal Swab     Status: Abnormal   Collection Time: 06/12/21  2:21 AM   Specimen: Nasopharyngeal Swab; Nasopharyngeal(NP) swabs in vial transport medium  Result Value Ref Range   SARS Coronavirus 2 by RT PCR POSITIVE (A) NEGATIVE   Influenza A by PCR NEGATIVE NEGATIVE   Influenza B by PCR NEGATIVE NEGATIVE  Urinalysis, Complete w Microscopic Urine, Clean Catch     Status: Abnormal   Collection Time: 06/12/21  3:40 AM  Result Value Ref Range   Color, Urine YELLOW YELLOW   APPearance HAZY (A) CLEAR   Specific Gravity, Urine 1.018 1.005 - 1.030   pH 7.0 5.0 - 8.0   Glucose, UA NEGATIVE NEGATIVE mg/dL   Hgb urine dipstick NEGATIVE NEGATIVE   Bilirubin Urine NEGATIVE NEGATIVE   Ketones, ur NEGATIVE NEGATIVE mg/dL   Protein, ur 449 (A) NEGATIVE mg/dL   Nitrite NEGATIVE NEGATIVE   Leukocytes,Ua NEGATIVE NEGATIVE   RBC / HPF 0-5 0 - 5 RBC/hpf   WBC, UA 0-5 0 - 5 WBC/hpf   Bacteria, UA RARE (A) NONE SEEN   Squamous Epithelial / LPF 6-10 0 - 5  Urine rapid drug screen (hosp performed)     Status: Abnormal   Collection Time: 06/12/21  3:41 AM  Result Value Ref Range   Opiates NONE DETECTED NONE DETECTED   Cocaine NONE DETECTED NONE DETECTED   Benzodiazepines POSITIVE (A) NONE DETECTED   Amphetamines POSITIVE (A) NONE DETECTED   Tetrahydrocannabinol NONE DETECTED NONE DETECTED   Barbiturates POSITIVE (A) NONE DETECTED  HIV Antibody (routine testing w rflx)     Status: None  Collection Time: 06/12/21  8:10 AM  Result Value Ref Range   HIV Screen 4th Generation wRfx Non Reactive Non Reactive  CBC     Status: Abnormal   Collection Time: 06/12/21  8:10 AM  Result Value Ref Range   WBC 8.7 4.0 - 10.5 K/uL   RBC 3.77 (L) 3.87 - 5.11 MIL/uL   Hemoglobin 12.8 12.0 - 15.0 g/dL   HCT 34.1 93.7 - 90.2 %   MCV 102.1 (H) 80.0 - 100.0 fL    MCH 34.0 26.0 - 34.0 pg   MCHC 33.2 30.0 - 36.0 g/dL   RDW 40.9 73.5 - 32.9 %   Platelets 113 (L) 150 - 400 K/uL   nRBC 0.0 0.0 - 0.2 %  Creatinine, serum     Status: None   Collection Time: 06/12/21  8:10 AM  Result Value Ref Range   Creatinine, Ser 0.71 0.44 - 1.00 mg/dL   GFR, Estimated >92 >42 mL/min  D-dimer, quantitative     Status: Abnormal   Collection Time: 06/12/21  8:10 AM  Result Value Ref Range   D-Dimer, Quant 1.62 (H) 0.00 - 0.50 ug/mL-FEU  Procalcitonin     Status: None   Collection Time: 06/12/21  8:10 AM  Result Value Ref Range   Procalcitonin 0.20 ng/mL  Troponin I (High Sensitivity)     Status: Abnormal   Collection Time: 06/12/21  8:10 AM  Result Value Ref Range   Troponin I (High Sensitivity) 20 (H) <18 ng/L    Recent Results (from the past 240 hour(s))  Resp Panel by RT-PCR (Flu A&B, Covid) Nasopharyngeal Swab     Status: Abnormal   Collection Time: 06/12/21  2:21 AM   Specimen: Nasopharyngeal Swab; Nasopharyngeal(NP) swabs in vial transport medium  Result Value Ref Range Status   SARS Coronavirus 2 by RT PCR POSITIVE (A) NEGATIVE Final    Comment: RESULT CALLED TO, READ BACK BY AND VERIFIED WITH: RN BOBBY SAGALANG  BY MESSAN H. AT 0348 ON 7 26 2022 (NOTE) SARS-CoV-2 target nucleic acids are DETECTED.  The SARS-CoV-2 RNA is generally detectable in upper respiratory specimens during the acute phase of infection. Positive results are indicative of the presence of the identified virus, but do not rule out bacterial infection or co-infection with other pathogens not detected by the test. Clinical correlation with patient history and other diagnostic information is necessary to determine patient infection status. The expected result is Negative.  Fact Sheet for Patients: BloggerCourse.com  Fact Sheet for Healthcare Providers: SeriousBroker.it  This test is not yet approved or cleared by the [place] FDA and  has been authorized for detection and/or diagnosis of SARS-CoV-2 by FDA under an Emergency Use Authorization (EUA).  This EUA will remain in effect (meani ng this test can be used) for the duration of  the COVID-19 declaration under Section 564(b)(1) of the Act, 21 U.S.C. section 360bbb-3(b)(1), unless the authorization is terminated or revoked sooner.     Influenza A by PCR NEGATIVE NEGATIVE Final   Influenza B by PCR NEGATIVE NEGATIVE Final    Comment: (NOTE) The Xpert Xpress SARS-CoV-2/FLU/RSV plus assay is intended as an aid in the diagnosis of influenza from Nasopharyngeal swab specimens and should not be used as a sole basis for treatment. Nasal washings and aspirates are unacceptable for Xpert Xpress SARS-CoV-2/FLU/RSV testing.  Fact Sheet for Patients: BloggerCourse.com  Fact Sheet for Healthcare Providers: SeriousBroker.it  This test is not yet approved or cleared by the [place] FDA  and has been authorized for detection and/or diagnosis of SARS-CoV-2 by FDA under an Emergency Use Authorization (EUA). This EUA will remain in effect (meaning this test can be used) for the duration of the COVID-19 declaration under Section 564(b)(1) of the Act, 21 U.S.C. section 360bbb-3(b)(1), unless the authorization is terminated or revoked.  Performed at [facility], [address] [place], [place] [address]      Radiology Studies: CT ABDOMEN PELVIS WO CONTRAST  Result Date: 06/12/2021 CLINICAL DATA:  Diffuse abdominal pain, distention, altered mental status EXAM: CT ABDOMEN AND PELVIS WITHOUT CONTRAST TECHNIQUE: Multidetector CT imaging of the abdomen and pelvis was performed following the standard protocol without IV contrast. COMPARISON:  None. FINDINGS: Lower chest: Large hiatal hernia. Heart is normal size. Patchy ground-glass opacities in the lingula and both lower lobes as well as right middle lobe  could reflect areas of pneumonia. No effusions. Hepatobiliary: No focal liver abnormality is seen. Status post cholecystectomy. No biliary dilatation. Pancreas: No focal abnormality or ductal dilatation. Spleen: No focal abnormality.  Normal size. Adrenals/Urinary Tract: No adrenal abnormality. No focal renal abnormality. No stones or hydronephrosis. Urinary bladder is unremarkable. Stomach/Bowel: Stomach, large and small bowel grossly unremarkable. Vascular/Lymphatic: Aortic atherosclerosis. No evidence of aneurysm or adenopathy. Reproductive: Prior hysterectomy.  No adnexal masses. Other: No free fluid or free air. Musculoskeletal: No acute bony abnormality. IMPRESSION: Large hiatal hernia. Patchy ground-glass airspace opacities in the lung bases could reflect early multifocal pneumonia. Aortic atherosclerosis. No acute findings in the abdomen or pelvis. Electronically Signed   By: Charlett Nose M.D.   On: 06/12/2021 10:15   DG Chest 2 View  Result Date: 06/12/2021 CLINICAL DATA:  Cough, altered mental status EXAM: CHEST - 2 VIEW COMPARISON:  None. FINDINGS: Diffuse interstitial opacity throughout the lungs with some mild airways thickening. No pneumothorax or effusion. The aorta is calcified. The remaining cardiomediastinal contours are unremarkable. Degenerative changes are present in the imaged spine and shoulders. No acute osseous or soft tissue abnormality. IMPRESSION: Diffuse interstitial opacities, could reflect acute or chronic interstitial changes and/or developing edema. Aortic Atherosclerosis (ICD10-I70.0). Electronically Signed   By: Kreg Shropshire M.D.   On: 06/12/2021 01:08   DG Tibia/Fibula Left  Result Date: 06/12/2021 CLINICAL DATA:  Left lower extremity swelling. EXAM: LEFT TIBIA AND FIBULA - 2 VIEW COMPARISON:  None. FINDINGS: The knee and ankle joints are maintained. The tibia and fibula are intact. IMPRESSION: No acute bony findings. Electronically Signed   By: Rudie Meyer M.D.   On:  06/12/2021 07:08   CT HEAD WO CONTRAST  Result Date: 06/12/2021 CLINICAL DATA:  60 year old female with altered mental status. EXAM: CT HEAD WITHOUT CONTRAST TECHNIQUE: Contiguous axial images were obtained from the base of the skull through the vertex without intravenous contrast. COMPARISON:  None. FINDINGS: Study is mildly degraded by motion artifact despite repeated imaging attempts. Brain: Cerebral volume is within normal limits for age. No midline shift, ventriculomegaly, mass effect, evidence of mass lesion, intracranial hemorrhage or evidence of cortically based acute infarction. Largely normal for age gray-white matter differentiation, minimal to mild scattered white matter hypodensity. No cortical encephalomalacia identified. Vascular: Calcified atherosclerosis at the skull base. No suspicious intracranial vascular hyperdensity. Skull: No acute osseous abnormality identified. Sinuses/Orbits: Mild bilateral paranasal sinus mucosal thickening, relatively sparing the frontal sinuses. No sinus fluid level identified. Other: Bubbly opacity throughout the bilateral nasal cavity and nasopharynx. Tympanic cavities and mastoids are clear. Visualized orbit soft tissues are within normal limits. Visualized  scalp soft tissues are within normal limits. IMPRESSION: 1. Mild motion artifact. Normal for age non contrast CT appearance of the brain. 2. Retained secretions in the nasopharynx and mild bilateral paranasal sinus inflammation. Electronically Signed   By: Odessa Fleming M.D.   On: 06/12/2021 04:14   DG FEMUR MIN 2 VIEWS LEFT  Result Date: 06/12/2021 CLINICAL DATA:  Pain and bruising. History of cardiac catheterization 2 weeks ago. EXAM: LEFT FEMUR 2 VIEWS COMPARISON:  None. FINDINGS: The left hip is normally located. No fracture or AVN. The visualized left hemipelvic bony structures are intact. The left femur is intact. IMPRESSION: No acute bony findings. Electronically Signed   By: Rudie Meyer M.D.   On:  06/12/2021 07:07   CT ABDOMEN PELVIS WO CONTRAST  Final Result    DG FEMUR MIN 2 VIEWS LEFT  Final Result    DG Tibia/Fibula Left  Final Result    CT HEAD WO CONTRAST  Final Result    DG Chest 2 View  Final Result    VAS [place] LOWER EXTREMITY VENOUS (DVT)    (Results Pending)    Scheduled Meds:  aspirin EC  81 mg Oral Daily   atorvastatin  40 mg Oral QHS   clopidogrel  75 mg Oral Daily   enoxaparin (LOVENOX) injection  40 mg Subcutaneous Q24H   Fluticasone-Umeclidin-Vilant  1 puff Oral Daily   methylPREDNISolone (SOLU-MEDROL) injection  60 mg Intravenous BID   pregabalin  200 mg Oral TID   PRN Meds: acetaminophen **OR** acetaminophen Continuous Infusions:  [START ON 06/13/2021] remdesivir 100 mg in NS 100 mL       LOS: 0 days  Time spent: Greater than 50% of the 35 minute visit was spent in counseling/coordination of care for the patient as laid out in the A&P.   Lewie Chamber, MD [facility] 06/12/2021, 1:08 PM

## 2021-06-12 NOTE — Assessment & Plan Note (Signed)
-   Will replete and recheck as needed

## 2021-06-12 NOTE — ED Provider Notes (Signed)
[facility] EMERGENCY DEPARTMENT Provider Note   CSN: [id number] Arrival date & time: 06/11/21  2326     History Chief Complaint  Patient presents with   Altered Mental Status    Kim Greene is a 60 y.o. female.  Patient is a 60 year old female brought to the ER by her son for evaluation of confusion and altered mental status.  Patient tells me that she began feeling poorly yesterday and that this has worsened today.  The son reports confusion and is concerned she may be having a stroke.  Patient tells me she had a heart cath performed at a hospital in [place] 2 weeks ago, but does not believe that she was stented.  Patient denies fever at home, but was found to be febrile with a temp of 101.7 here.  The history is provided by the patient.  Altered Mental Status Presenting symptoms: confusion and disorientation   Severity:  Moderate Most recent episode:  Today Episode history:  Single Timing:  Constant Progression:  Partially resolved Chronicity:  New Context: not alcohol use, not drug use and not recent change in medication   Associated symptoms: fever   Associated symptoms: no headaches       History reviewed. No pertinent past medical history.  There are no problems to display for this patient.   History reviewed. No pertinent surgical history.   OB History   No obstetric history on file.     Family History  Problem Relation Age of Onset   Diabetes Mother    Hypertension Mother    Stroke Mother    Hyperlipidemia Mother    Lung cancer Father     Social History   Tobacco Use   Smoking status: Every Day    Types: Cigarettes   Smokeless tobacco: Never  Substance Use Topics   Alcohol use: Never   Drug use: Never    Home Medications Prior to Admission medications   Not on File    Allergies    Patient has no known allergies.  Review of Systems   Review of Systems  Constitutional:  Positive for fever.  Neurological:   Negative for headaches.  Psychiatric/Behavioral:  Positive for confusion.   All other systems reviewed and are negative.  Physical Exam Updated Vital Signs BP 129/77 (BP Location: Right Arm)   Pulse 80   Resp (!) 24   SpO2 97%   Physical Exam Vitals and nursing note reviewed.  Constitutional:      General: She is not in acute distress.    Appearance: She is well-developed. She is not diaphoretic.  HENT:     Head: Normocephalic and atraumatic.  Cardiovascular:     Rate and Rhythm: Normal rate and regular rhythm.     Heart sounds: No murmur heard.   No friction rub. No gallop.  Pulmonary:     Effort: Pulmonary effort is normal. No respiratory distress.     Breath sounds: Rhonchi present. No wheezing.     Comments: There are rhonchorous breath sounds with expiration throughout both lung fields. Abdominal:     General: Bowel sounds are normal. There is no distension.     Palpations: Abdomen is soft.     Tenderness: There is no abdominal tenderness.  Musculoskeletal:        General: No swelling or tenderness. Normal range of motion.     Cervical back: Normal range of motion and neck supple.     Right lower leg: No edema.  Left lower leg: No edema.  Skin:    General: Skin is warm and dry.  Neurological:     General: No focal deficit present.     Mental Status: She is alert and oriented to person, place, and time.    ED Results / Procedures / Treatments   Labs (all labs ordered are listed, but only abnormal results are displayed) Labs Reviewed  COMPREHENSIVE METABOLIC PANEL - Abnormal; Notable for the following components:      Result Value   Calcium 7.2 (*)    Total Protein 6.4 (*)    Albumin 3.1 (*)    All other components within normal limits  CBC WITH DIFFERENTIAL/PLATELET - Abnormal; Notable for the following components:   MCV 103.1 (*)    Platelets 126 (*)    All other components within normal limits  I-STAT ARTERIAL BLOOD GAS, ED - Abnormal; Notable for the  following components:   pCO2 arterial 52.7 (*)    pO2, Arterial 293 (*)    Bicarbonate 34.2 (*)    TCO2 36 (*)    Acid-Base Excess 9.0 (*)    Potassium 3.4 (*)    Calcium, Ion 0.94 (*)    All other components within normal limits  RESP PANEL BY RT-PCR (FLU A&B, COVID) ARPGX2  AMMONIA  LACTIC ACID, PLASMA  ETHANOL  URINALYSIS, COMPLETE (UACMP) WITH MICROSCOPIC  RAPID URINE DRUG SCREEN, HOSP PERFORMED  BLOOD GAS, ARTERIAL  BRAIN NATRIURETIC PEPTIDE  CBG MONITORING, ED  CBG MONITORING, ED    EKG None  Radiology DG Chest 2 View  Result Date: 06/12/2021 CLINICAL DATA:  Cough, altered mental status EXAM: CHEST - 2 VIEW COMPARISON:  None. FINDINGS: Diffuse interstitial opacity throughout the lungs with some mild airways thickening. No pneumothorax or effusion. The aorta is calcified. The remaining cardiomediastinal contours are unremarkable. Degenerative changes are present in the imaged spine and shoulders. No acute osseous or soft tissue abnormality. IMPRESSION: Diffuse interstitial opacities, could reflect acute or chronic interstitial changes and/or developing edema. Aortic Atherosclerosis (ICD10-I70.0). Electronically Signed   By: Kreg Shropshire M.D.   On: 06/12/2021 01:08    Procedures Procedures   Medications Ordered in ED Medications - No data to display  ED Course  I have reviewed the triage vital signs and the nursing notes.  Pertinent labs & imaging results that were available during my care of the patient were reviewed by me and considered in my medical decision making (see chart for details).    MDM Rules/Calculators/A&P  Patient presenting here with confusion and was found to be febrile and hypoxic.  COVID test is positive, but work-up otherwise unremarkable including head CT and laboratory studies.  Patient was found to have hypoxia with saturations in the 70s which corrected with supplemental oxygen.  She does have an oxygen requirement of 4 L nasal cannula to  maintain her saturations in the mid to upper 90s.  Breath sounds are rhonchorous.  At this point, patient will require admission.  I spoke with Dr. Toniann Fail who agrees to admit.  CRITICAL CARE Performed by: Geoffery Lyons Total critical care time: 40 minutes Critical care time was exclusive of separately billable procedures and treating other patients. Critical care was necessary to treat or prevent imminent or life-threatening deterioration. Critical care was time spent personally by me on the following activities: development of treatment plan with patient and/or surrogate as well as nursing, discussions with consultants, evaluation of patient's response to treatment, examination of patient, obtaining history from patient or  surrogate, ordering and performing treatments and interventions, ordering and review of laboratory studies, ordering and review of radiographic studies, pulse oximetry and re-evaluation of patient's condition.   Final Clinical Impression(s) / ED Diagnoses Final diagnoses:  None    Rx / DC Orders ED Discharge Orders     None        Geoffery Lyons, MD 06/12/21 [phone]

## 2021-06-12 NOTE — H&P (Signed)
History and Physical    Kim Greene VWU:[id number] DOB: 1961/04/19 DOA: 06/11/2021  PCP: Patient, No Pcp Per (Inactive)  Patient coming from: Home.  History obtained from patient's son.  Patient is confused.  Chief Complaint: Confusion and patient passed out.  HPI: Kim Greene is a 60 y.o. female with history of COPD, chronic pain and also on Suboxone was recently admitted at the hospital in [facility], [facility] what looks like could be an nonestrogen MI when patient underwent cardiac cath and was told to take aspirin Plavix and statins which are new at the time patient also was placed on Z-Pak and steroids was discharged about 4 days ago had come to [place] to help her daughter to move back to her CT.  Last evening when patient went with her son for shopping at [employer] patient appeared confused and was having some strange smell and later on passed out on the floor.  She did regain consciousness soon but was brought to the ER.  Did not complain of any chest pain but did have shortness of breath.  After her discharge from the hospital 4 days ago she was found to have bruising on the left lower extremity and left lower quadrant of the abdomen.  Denies any abdominal pain.  Patient usually takes Vyvanse but hospital doctor at discharge advised her to stop taking Vyvanse due to her cardiac issues she had at the hospital.  ED Course: In the ER patient was confused initially requiring 100% nonrebreather presently on 5 L.  Chest x-ray shows bilateral infiltrates COVID test came back positive patient also was febrile with temperature of 101.7.  CT head is unremarkable.  EKG shows normal sinus rhythm with nonspecific ST-T changes.  Patient is started on Decadron remdesivir admitted for acute hypoxic respiratory failure secondary to COVID infection.  Review of Systems: As per HPI, rest all negative.   Past Medical History:  Diagnosis Date   Coronary artery disease     GERD (gastroesophageal reflux disease)     Past Surgical History:  Procedure Laterality Date   ABDOMINAL HYSTERECTOMY     CARDIAC CATHETERIZATION     CHOLECYSTECTOMY       reports that she has been smoking cigarettes. She has never used smokeless tobacco. She reports that she does not drink alcohol and does not use drugs.  No Known Allergies  Family History  Problem Relation Age of Onset   Diabetes Mother    Hypertension Mother    Stroke Mother    Hyperlipidemia Mother    Lung cancer Father     Prior to Admission medications   Medication Sig Start Date End Date Taking? Authorizing Provider  albuterol (PROVENTIL) (2.5 MG/3ML) 0.083% nebulizer solution Take by nebulization. 06/07/21   [provider]  alendronate (FOSAMAX) 70 MG tablet Take 70 mg by mouth once a week. 06/08/21   [provider]  atorvastatin (LIPITOR) 40 MG tablet Take 40 mg by mouth daily. 06/07/21   [provider]  buprenorphine-naloxone (SUBOXONE) 8-2 mg SUBL SL tablet Place 1 tablet under the tongue 3 (three) times daily as needed for pain. 06/08/21   [provider]  clopidogrel (PLAVIX) 75 MG tablet Take 75 mg by mouth daily. 06/07/21   [provider]  methylPREDNISolone (MEDROL DOSEPAK) 4 MG TBPK tablet Take 4 mg by mouth as directed. 6 day taper 06/07/21   [provider]  ondansetron (ZOFRAN-ODT) 8 MG disintegrating tablet Take 8 mg by mouth 3 (three)  times daily. 05/21/21   [provider]  pregabalin (LYRICA) 200 MG capsule Take 200 mg by mouth 3 (three) times daily. 06/08/21   [provider]  QUEtiapine (SEROQUEL) 25 MG tablet Take 25 mg by mouth at bedtime. 05/21/21   [provider]  TRELEGY ELLIPTA 100-62.5-25 MCG/INH AEPB Take 1 puff by mouth daily. 05/21/21   [provider]  Vitamin D, Ergocalciferol, (DRISDOL) 1.25 MG (50000 UNIT) CAPS capsule Take 50,000 Units by mouth once a week. 06/08/21   [provider]   VYVANSE 30 MG CHEW Chew 30 mg by mouth daily. 05/21/21   [provider]    Physical Exam: Constitutional: Moderately built and nourished. Vitals:   06/12/21 0345 06/12/21 0400 06/12/21 0415 06/12/21 0430  BP: 115/82 (!) 105/59 109/71 120/68  Pulse: 89 87 87 87  Resp: 16 17 17  (!) 24  Temp:      TempSrc:      SpO2: 95% 94% 95% 96%   Eyes: Anicteric no pallor. ENMT: No discharge from the ears eyes nose and mouth. Neck: No mass felt.  No neck rigidity. Respiratory: No rhonchi or crepitations. Cardiovascular: S1-S2 heard. Abdomen: Bruise on the left lower quadrant.  No guarding or rigidity. Musculoskeletal: There is ecchymotic areas on the left lower extremity. Skin: Ecchymotic areas in the left lower extremity. Neurologic: Alert awake oriented to name and place.  Moving all extremities. Psychiatric: Oriented to name and place.   Labs on Admission: I have personally reviewed following labs and imaging studies  CBC: Recent Labs  Lab 06/12/21 0003 06/12/21 0209  WBC 8.0  --   NEUTROABS 6.0  --   HGB 14.0 12.9  HCT 43.0 38.0  MCV 103.1*  --   PLT 126*  --    Basic Metabolic Panel: Recent Labs  Lab 06/12/21 0003 06/12/21 0209  NA 137 136  K 3.5 3.4*  CL 98  --   CO2 29  --   GLUCOSE 90  --   BUN 8  --   CREATININE 0.82  --   CALCIUM 7.2*  --    GFR: CrCl cannot be calculated (Unknown ideal weight.). Liver Function Tests: Recent Labs  Lab 06/12/21 0003  AST 18  ALT 17  ALKPHOS 73  BILITOT 1.0  PROT 6.4*  ALBUMIN 3.1*   No results for input(s): LIPASE, AMYLASE in the last 168 hours. Recent Labs  Lab 06/12/21 0003  AMMONIA 33   Coagulation Profile: No results for input(s): INR, PROTIME in the last 168 hours. Cardiac Enzymes: No results for input(s): CKTOTAL, CKMB, CKMBINDEX, TROPONINI in the last 168 hours. BNP (last 3 results) No results for input(s): PROBNP in the last 8760 hours. HbA1C: No results for input(s): HGBA1C in the last 72  hours. CBG: Recent Labs  Lab 06/11/21 2347  GLUCAP 90   Lipid Profile: No results for input(s): CHOL, HDL, LDLCALC, TRIG, CHOLHDL, LDLDIRECT in the last 72 hours. Thyroid Function Tests: No results for input(s): TSH, T4TOTAL, FREET4, T3FREE, THYROIDAB in the last 72 hours. Anemia Panel: No results for input(s): VITAMINB12, FOLATE, FERRITIN, TIBC, IRON, RETICCTPCT in the last 72 hours. Urine analysis:    Component Value Date/Time   COLORURINE YELLOW 06/12/2021 0340   APPEARANCEUR HAZY (A) 06/12/2021 0340   LABSPEC 1.018 06/12/2021 0340   PHURINE 7.0 06/12/2021 0340   GLUCOSEU NEGATIVE 06/12/2021 0340   HGBUR NEGATIVE 06/12/2021 0340   BILIRUBINUR NEGATIVE 06/12/2021 0340   KETONESUR NEGATIVE 06/12/2021 0340   PROTEINUR 100 (  A) 06/12/2021 0340   NITRITE NEGATIVE 06/12/2021 0340   LEUKOCYTESUR NEGATIVE 06/12/2021 0340   Sepsis Labs: @LABRCNTIP (procalcitonin:4,lacticidven:4) ) Recent Results (from the past 240 hour(s))  Resp Panel by RT-PCR (Flu A&B, Covid) Nasopharyngeal Swab     Status: Abnormal   Collection Time: 06/12/21  2:21 AM   Specimen: Nasopharyngeal Swab; Nasopharyngeal(NP) swabs in vial transport medium  Result Value Ref Range Status   SARS Coronavirus 2 by RT PCR POSITIVE (A) NEGATIVE Final    Comment: RESULT CALLED TO, READ BACK BY AND VERIFIED WITH: RN BOBBY SAGALANG  BY MESSAN H. AT 0348 ON 7 26 2022 (NOTE) SARS-CoV-2 target nucleic acids are DETECTED.  The SARS-CoV-2 RNA is generally detectable in upper respiratory specimens during the acute phase of infection. Positive results are indicative of the presence of the identified virus, but do not rule out bacterial infection or co-infection with other pathogens not detected by the test. Clinical correlation with patient history and other diagnostic information is necessary to determine patient infection status. The expected result is Negative.  Fact Sheet for  Patients: BloggerCourse.comhttps://www.fda.gov/media/152166/download  Fact Sheet for Healthcare Providers: SeriousBroker.ithttps://www.fda.gov/media/152162/download  This test is not yet approved or cleared by the [place] FDA and  has been authorized for detection and/or diagnosis of SARS-CoV-2 by FDA under an Emergency Use Authorization (EUA).  This EUA will remain in effect (meani ng this test can be used) for the duration of  the COVID-19 declaration under Section 564(b)(1) of the Act, 21 U.S.C. section 360bbb-3(b)(1), unless the authorization is terminated or revoked sooner.     Influenza A by PCR NEGATIVE NEGATIVE Final   Influenza B by PCR NEGATIVE NEGATIVE Final    Comment: (NOTE) The Xpert Xpress SARS-CoV-2/FLU/RSV plus assay is intended as an aid in the diagnosis of influenza from Nasopharyngeal swab specimens and should not be used as a sole basis for treatment. Nasal washings and aspirates are unacceptable for Xpert Xpress SARS-CoV-2/FLU/RSV testing.  Fact Sheet for Patients: BloggerCourse.comhttps://www.fda.gov/media/152166/download  Fact Sheet for Healthcare Providers: SeriousBroker.ithttps://www.fda.gov/media/152162/download  This test is not yet approved or cleared by the [place] FDA and has been authorized for detection and/or diagnosis of SARS-CoV-2 by FDA under an Emergency Use Authorization (EUA). This EUA will remain in effect (meaning this test can be used) for the duration of the COVID-19 declaration under Section 564(b)(1) of the Act, 21 U.S.C. section 360bbb-3(b)(1), unless the authorization is terminated or revoked.  Performed at [facility], [address] [place], [place] [address]      Radiological Exams on Admission: DG Chest 2 View  Result Date: 06/12/2021 CLINICAL DATA:  Cough, altered mental status EXAM: CHEST - 2 VIEW COMPARISON:  None. FINDINGS: Diffuse interstitial opacity throughout the lungs with some mild airways thickening. No pneumothorax or effusion. The aorta is calcified.  The remaining cardiomediastinal contours are unremarkable. Degenerative changes are present in the imaged spine and shoulders. No acute osseous or soft tissue abnormality. IMPRESSION: Diffuse interstitial opacities, could reflect acute or chronic interstitial changes and/or developing edema. Aortic Atherosclerosis (ICD10-I70.0). Electronically Signed   By: Kreg ShropshirePrice  DeHay M.D.   On: 06/12/2021 01:08   CT HEAD WO CONTRAST  Result Date: 06/12/2021 CLINICAL DATA:  60 year old female with altered mental status. EXAM: CT HEAD WITHOUT CONTRAST TECHNIQUE: Contiguous axial images were obtained from the base of the skull through the vertex without intravenous contrast. COMPARISON:  None. FINDINGS: Study is mildly degraded by motion artifact despite repeated imaging attempts. Brain: Cerebral volume is within normal limits  for age. No midline shift, ventriculomegaly, mass effect, evidence of mass lesion, intracranial hemorrhage or evidence of cortically based acute infarction. Largely normal for age gray-white matter differentiation, minimal to mild scattered white matter hypodensity. No cortical encephalomalacia identified. Vascular: Calcified atherosclerosis at the skull base. No suspicious intracranial vascular hyperdensity. Skull: No acute osseous abnormality identified. Sinuses/Orbits: Mild bilateral paranasal sinus mucosal thickening, relatively sparing the frontal sinuses. No sinus fluid level identified. Other: Bubbly opacity throughout the bilateral nasal cavity and nasopharynx. Tympanic cavities and mastoids are clear. Visualized orbit soft tissues are within normal limits. Visualized scalp soft tissues are within normal limits. IMPRESSION: 1. Mild motion artifact. Normal for age non contrast CT appearance of the brain. 2. Retained secretions in the nasopharynx and mild bilateral paranasal sinus inflammation. Electronically Signed   By: Odessa Fleming M.D.   On: 06/12/2021 04:14    EKG: Independently reviewed.  Normal  sinus rhythm.  Nonspecific ST-T changes.  Assessment/Plan Principal Problem:   Acute hypoxemic respiratory failure due to COVID-19 [facility]) Active Problems:   HLD (hyperlipidemia)   COPD (chronic obstructive pulmonary disease) (HCC)    Acute respiratory failure with hypoxia presently on 5 L oxygen likely from COVID infection for which patient has been started on Decadron and remdesivir.  If patient's oxygen requirement goes up may need baricitinib or Actemra.  We will check inflammatory markers closely follow respiratory status. Acute encephalopathy likely from hypoxia.  CT head unremarkable.  Patient is on Suboxone and Lyrica which is not new and has been taking for many years.  Patient's Vyvanse was recently discontinued by patient's physician at the discharging facility last week after patient was told that she had a cardiac issue and not to take it.  We will check ammonia levels.  ABG shows PCO2 of 52.7 but pH is normal. Recent hospital admission last week for possible non-ST elevation MI.  We will need to get records from the discharging hospital at [facility] in [place].  I have requested to get the records.  Patient was started on aspirin Plavix and statins.  Which we will continue for now. COPD on home oxygen usually uses 2 L on Trelegy. History of chronic pain on Lyrica and Suboxone. History of ADHD recently discontinued Vyvanse. History depression is on Seroquel which as per the patient's has not been taking regularly. Ecchymotic areas in the abdomen and lower extremities of the left side.  I have ordered a CT abdomen to make sure there is no hematoma.  Since patient has acute respiratory failure with COVID infection will need inpatient status.   DVT prophylaxis: Lovenox. Code Status: Full code. Family Communication: Patient's son. Disposition Plan: Home when stable. Consults called: None. Admission status: Inpatient.   Eduard Clos MD [facility] Pager [phone].  If 7PM-7AM, please contact night-coverage [email or web address] Password [facility]  06/12/2021, 6:11 AM

## 2021-06-12 NOTE — ED Notes (Signed)
[facility] notified this EMT that the patient's son reported the patient wears oxygen all of the time. PA notified of this.

## 2021-06-12 NOTE — ED Notes (Signed)
RN aware of PT 02 levels

## 2021-06-12 NOTE — Hospital Course (Signed)
Kim Greene is a 60 yo female with PMH COPD, chronic pain and also on Suboxone was recently admitted at the hospital in [facility] what looks like could be an NSTEMI when patient underwent cardiac cath and was told to take aspirin Plavix and statins which are new at the time patient also was placed on Z-Pak and steroids was discharged about 4 days PTA, she had come to [facility] to help her daughter to move back to her CT.   Evening prior to admission, she went with her son for shopping at [employer] patient appeared confused and was having some strange smell and later on passed out on the floor.  She did regain consciousness soon but was brought to the ER.  Did not complain of any chest pain but did have shortness of breath.   After her discharge from the hospital 4 days ago she was found to have bruising on the left lower extremity and left lower quadrant of the abdomen.  Denies any abdominal pain.  Patient usually takes Vyvanse but hospital doctor at discharge advised her to stop taking Vyvanse due to her cardiac issues she had at the hospital.   ED Course: In the ER patient was confused initially requiring 100% nonrebreather presently on 5 L.  Chest x-ray shows bilateral infiltrates COVID test came back positive patient also was febrile with temperature of 101.7.  CT head is unremarkable.  EKG shows normal sinus rhythm with nonspecific ST-T changes.  Patient is started on Decadron remdesivir admitted for acute hypoxic respiratory failure secondary to COVID infection. She confirmed with her son present in the ER that she wished to be a DNR/DNI.

## 2021-06-12 NOTE — ED Notes (Signed)
This RN reapplied pt oxygen as pt keeps removing it.

## 2021-06-12 NOTE — ED Notes (Signed)
Patient found sitting in the chair, attempting to changed out of gown and into her street clothes. Patient repeatedly claiming she needs to leave, attempted to re-direct patient, unsuccessful. Notified admitting Dr. Toniann Fail via secure chat.

## 2021-06-12 NOTE — Assessment & Plan Note (Signed)
-   Records requested from [facility] where she was there recently for possible NSTEMI - Continue aspirin, Plavix, statin

## 2021-06-12 NOTE — Assessment & Plan Note (Addendum)
-   Check iron studies (low, started on oral iron) - folate 8.5, started MVI - B12 = 86 (started on oral B12; no neuropathic complaints)

## 2021-06-12 NOTE — ED Notes (Signed)
Patient o2 delivery changed from NRB to [place] at 5L; sp02 at 91%.

## 2021-06-12 NOTE — Assessment & Plan Note (Signed)
-   At risk for further decompensation with underlying newly diagnosed CAD.  Awaiting records from [facility] -Continue remdesivir and steroids - CODE STATUS confirmed in the ER, she elects for DNR/DNI, her son was bedside as witness for this conversation -Change Decadron to Solu-Medrol - Check inflammatory markers daily - Encourage incentive spirometer, flutter, and self proning

## 2021-06-12 NOTE — ED Notes (Signed)
This EMT to obtain full set of VS. Patient found to be at 80% SpO2 on 4 lpm nasal cannula. Increased to 6 lpm nasal cannula, Sp02 only improved to 83%. Patient placed on NRB at 15 lpm, SpO2 improved to 96%. Charge RN notified and patient was roomed.

## 2021-06-12 NOTE — Assessment & Plan Note (Signed)
-   continue steroids and breathing treatments

## 2021-06-13 ENCOUNTER — Other Ambulatory Visit: Payer: Self-pay

## 2021-06-13 ENCOUNTER — Inpatient Hospital Stay (HOSPITAL_COMMUNITY): Payer: Medicare Other

## 2021-06-13 DIAGNOSIS — J1282 Pneumonia due to coronavirus disease 2019: Secondary | ICD-10-CM | POA: Diagnosis not present

## 2021-06-13 DIAGNOSIS — M7989 Other specified soft tissue disorders: Secondary | ICD-10-CM

## 2021-06-13 DIAGNOSIS — J9621 Acute and chronic respiratory failure with hypoxia: Secondary | ICD-10-CM | POA: Diagnosis not present

## 2021-06-13 DIAGNOSIS — T148XXA Other injury of unspecified body region, initial encounter: Secondary | ICD-10-CM

## 2021-06-13 DIAGNOSIS — U071 COVID-19: Secondary | ICD-10-CM | POA: Diagnosis not present

## 2021-06-13 LAB — CBC WITH DIFFERENTIAL/PLATELET
Abs Immature Granulocytes: 0.06 10*3/uL (ref 0.00–0.07)
Basophils Absolute: 0 10*3/uL (ref 0.0–0.1)
Basophils Relative: 0 %
Eosinophils Absolute: 0 10*3/uL (ref 0.0–0.5)
Eosinophils Relative: 0 %
HCT: 39.4 % (ref 36.0–46.0)
Hemoglobin: 12.7 g/dL (ref 12.0–15.0)
Immature Granulocytes: 1 %
Lymphocytes Relative: 5 %
Lymphs Abs: 0.4 10*3/uL — ABNORMAL LOW (ref 0.7–4.0)
MCH: 32.8 pg (ref 26.0–34.0)
MCHC: 32.2 g/dL (ref 30.0–36.0)
MCV: 101.8 fL — ABNORMAL HIGH (ref 80.0–100.0)
Monocytes Absolute: 0.3 10*3/uL (ref 0.1–1.0)
Monocytes Relative: 3 %
Neutro Abs: 8.2 10*3/uL — ABNORMAL HIGH (ref 1.7–7.7)
Neutrophils Relative %: 91 %
Platelets: 157 10*3/uL (ref 150–400)
RBC: 3.87 MIL/uL (ref 3.87–5.11)
RDW: 13.6 % (ref 11.5–15.5)
WBC: 9 10*3/uL (ref 4.0–10.5)
nRBC: 0 % (ref 0.0–0.2)

## 2021-06-13 LAB — COMPREHENSIVE METABOLIC PANEL
ALT: 14 U/L (ref 0–44)
AST: 16 U/L (ref 15–41)
Albumin: 2.6 g/dL — ABNORMAL LOW (ref 3.5–5.0)
Alkaline Phosphatase: 59 U/L (ref 38–126)
Anion gap: 11 (ref 5–15)
BUN: 16 mg/dL (ref 6–20)
CO2: 30 mmol/L (ref 22–32)
Calcium: 6.9 mg/dL — ABNORMAL LOW (ref 8.9–10.3)
Chloride: 100 mmol/L (ref 98–111)
Creatinine, Ser: 0.79 mg/dL (ref 0.44–1.00)
GFR, Estimated: >60 mL/min (ref >60)
Glucose, Bld: 158 mg/dL — ABNORMAL HIGH (ref 70–99)
Potassium: 3.8 mmol/L (ref 3.5–5.1)
Sodium: 141 mmol/L (ref 135–145)
Total Bilirubin: 0.5 mg/dL (ref 0.3–1.2)
Total Protein: 5.8 g/dL — ABNORMAL LOW (ref 6.5–8.1)

## 2021-06-13 LAB — MAGNESIUM: Magnesium: 1.4 mg/dL — ABNORMAL LOW (ref 1.7–2.4)

## 2021-06-13 LAB — C-REACTIVE PROTEIN: CRP: 17.6 mg/dL — ABNORMAL HIGH (ref <1.0)

## 2021-06-13 LAB — D-DIMER, QUANTITATIVE: D-Dimer, Quant: 1.38 ug/mL-FEU — ABNORMAL HIGH (ref 0.00–0.50)

## 2021-06-13 MED ORDER — DM-GUAIFENESIN ER 30-600 MG PO TB12
1.0000 | ORAL_TABLET | Freq: Two times a day (BID) | ORAL | Status: DC
  Administered 2021-06-13 – 2021-06-16 (×7): 1 via ORAL
  Filled 2021-06-13 (×8): qty 1

## 2021-06-13 MED ORDER — ASCORBIC ACID 500 MG PO TABS
500.0000 mg | ORAL_TABLET | Freq: Every day | ORAL | Status: DC
  Administered 2021-06-13 – 2021-06-16 (×4): 500 mg via ORAL
  Filled 2021-06-13 (×4): qty 1

## 2021-06-13 MED ORDER — ADULT MULTIVITAMIN W/MINERALS CH
1.0000 | ORAL_TABLET | Freq: Every day | ORAL | Status: DC
  Administered 2021-06-13 – 2021-06-16 (×4): 1 via ORAL
  Filled 2021-06-13 (×4): qty 1

## 2021-06-13 MED ORDER — ENOXAPARIN SODIUM 40 MG/0.4ML IJ SOSY
40.0000 mg | PREFILLED_SYRINGE | Freq: Every day | INTRAMUSCULAR | Status: DC
  Administered 2021-06-14 – 2021-06-15 (×2): 40 mg via SUBCUTANEOUS
  Filled 2021-06-13 (×2): qty 0.4

## 2021-06-13 MED ORDER — BUPRENORPHINE HCL-NALOXONE HCL 8-2 MG SL SUBL
1.0000 | SUBLINGUAL_TABLET | Freq: Two times a day (BID) | SUBLINGUAL | Status: DC
Start: 2021-06-13 — End: 2021-06-16
  Administered 2021-06-13 – 2021-06-16 (×6): 1 via SUBLINGUAL
  Filled 2021-06-13 (×6): qty 1

## 2021-06-13 MED ORDER — MAGNESIUM SULFATE 4 GM/100ML IV SOLN
4.0000 g | Freq: Once | INTRAVENOUS | Status: AC
  Administered 2021-06-13: 4 g via INTRAVENOUS
  Filled 2021-06-13: qty 100

## 2021-06-13 MED ORDER — ZINC SULFATE 220 (50 ZN) MG PO CAPS
220.0000 mg | ORAL_CAPSULE | Freq: Every day | ORAL | Status: DC
  Administered 2021-06-13 – 2021-06-16 (×4): 220 mg via ORAL
  Filled 2021-06-13 (×4): qty 1

## 2021-06-13 MED ORDER — VITAMIN B-12 1000 MCG PO TABS
1000.0000 ug | ORAL_TABLET | Freq: Every day | ORAL | Status: DC
  Administered 2021-06-13 – 2021-06-16 (×4): 1000 ug via ORAL
  Filled 2021-06-13 (×4): qty 1

## 2021-06-13 MED ORDER — FERROUS SULFATE 325 (65 FE) MG PO TABS
325.0000 mg | ORAL_TABLET | Freq: Every day | ORAL | Status: DC
  Administered 2021-06-13 – 2021-06-16 (×4): 325 mg via ORAL
  Filled 2021-06-13 (×4): qty 1

## 2021-06-13 NOTE — Assessment & Plan Note (Addendum)
-   large abdominal bruise noted on admission  - No acute abnormality on CT abdomen/pelvis; large hiatal hernia noted - Possibly from previous Lovenox injections while at previous hospital - Bruise remained stable in size and appearance

## 2021-06-13 NOTE — Assessment & Plan Note (Signed)
-   Scattered bruises on left lower extremity.  Unclear etiology - Follow-up duplex - No pain, erythema

## 2021-06-13 NOTE — Progress Notes (Signed)
Dr. Frederick Peers was notified today that pt took her home med of Soboxone that was brought in by her son. Pt's home dose was ordered to be continued.

## 2021-06-13 NOTE — Progress Notes (Signed)
LLE venous duplex has been completed.   Results can be found under chart review under CV PROC. 06/13/2021 4:58 PM Evora Schechter RVT, RDMS

## 2021-06-13 NOTE — Progress Notes (Signed)
Progress Note    Kim [facility]   [id number]  DOB: [birth year]  DOA: 06/11/2021     1  PCP: Patient, No Pcp Per (Inactive)  CC: SOB, fever, cough  [facility] Course: [facility] is a 60 yo female with PMH COPD, chronic pain and also on Suboxone was [facility] [place] what looks like could be an NSTEMI when patient underwent cardiac cath and was told to take aspirin Plavix and statins which are new at the time patient also was placed on Z-Pak and steroids was discharged about 4 days PTA, she had come to [facility] to help her daughter to move back to her CT.   Evening prior to admission, she went with her son for shopping at [employer] patient appeared confused and was having some strange smell and later on passed out on the floor.  She did regain consciousness soon but was brought to the ER.  Did not complain of any chest pain but did have shortness of breath.   After her discharge from the [facility] 4 days ago she was found to have bruising on the left lower extremity and left lower quadrant of the abdomen.  Denies any abdominal pain.  Patient usually takes Vyvanse but [facility] doctor at discharge advised her to stop taking Vyvanse due to her cardiac issues she had at the [facility].   ED Course: In the ER patient was confused initially requiring 100% nonrebreather presently on 5 L.  Chest x-ray shows bilateral infiltrates COVID test came back positive patient also was febrile with temperature of 101.7.  CT head is unremarkable.  EKG shows normal sinus rhythm with nonspecific ST-T changes.  Patient is started on Decadron remdesivir admitted for acute hypoxic respiratory failure secondary to COVID infection. She confirmed with her son present in the ER that she wished to be a DNR/DNI.  Interval History:  No events overnight.  Other son is present this morning.  She states that she is starting to feel a little better.    ROS: Constitutional: positive for fatigue, fevers, and malaise, negative for night sweats, Respiratory: positive for cough, dyspnea on exertion, sputum, and wheezing, negative for pleurisy/chest pain, Cardiovascular: negative for chest pain, and Gastrointestinal: negative for abdominal pain  Assessment & Plan: * Pneumonia due to COVID-19 virus - At risk for further decompensation with underlying newly diagnosed CAD.  Awaiting records from [facility] -Continue remdesivir and steroids - CODE STATUS confirmed in the ER, she elects for DNR/DNI, her son was bedside as witness for this conversation -Change Decadron to Solu-Medrol - Check inflammatory markers daily - Encourage incentive spirometer, flutter, and self proning  Acute on chronic respiratory failure with hypoxia (HCC) - On 2 L chronically at home but has been noncompliant recently - Continues to smoke, 1-2 PPD/day -Continue O2 and wean as able -Acute worsening considered due to underlying COVID infection as well as probable COPD exacerbation  Left leg swelling - Scattered bruises on left lower extremity.  Unclear etiology - Follow-up duplex - No pain, erythema  Bruise - large abdominal bruise noted on admission  - No acute abnormality on CT abdomen/pelvis; large hiatal hernia noted - Possibly from previous Lovenox injections while at previous [facility] - Bruise remained stable in size and appearance  CAD (coronary artery disease) - Records requested from [facility] where she was there recently for possible NSTEMI - Continue aspirin, Plavix, statin  Macrocytosis without anemia - Check iron studies (low, started on  oral iron) - folate 8.5, started MVI - B12 = 86 (started on oral B12; no neuropathic complaints)  Hypokalemia - Will replete and recheck as needed  COPD (chronic obstructive pulmonary disease) (HCC) - continue steroids and breathing treatments   HLD (hyperlipidemia) - continue lipitor     Old records reviewed in assessment of this patient  Antimicrobials: Remdesivir 7/26 >> current  DVT prophylaxis: enoxaparin (LOVENOX) injection 40 mg Start: 06/14/21 2200   Code Status:   Code Status: DNR Family Communication: son  Disposition Plan: Status is: Inpatient  Remains inpatient appropriate because:IV treatments appropriate due to intensity of illness or inability to take PO and Inpatient level of care appropriate due to severity of illness  Dispo: The patient is from: Home              Anticipated d/c is to: Home              Patient currently is not medically stable to d/c.   Difficult to place patient No  Risk of unplanned readmission score: Unplanned Admission- Pilot do not use: 13.71   Objective: Blood pressure 107/64, pulse 76, temperature 97.9 F (36.6 C), temperature source Oral, resp. rate 19, SpO2 94 %.  Examination: General appearance: alert, cooperative, fatigued, and no distress Head: Normocephalic, without obvious abnormality, atraumatic Eyes:  EOMI Lungs:  Decreased air movement bilaterally, expiratory wheezing bilaterally, scattered rhonchi Heart: regular rate and rhythm and S1, S2 normal Abdomen: Lower abdominal bruising appreciated approximately 10 cm long by 5 cm wide.  Bowel sounds present, soft, nontender Extremities:  Bruising noted throughout entire left leg up to knee with associated 1+ edema Skin:  Bruising noted on abdomen and left leg Neurologic: Grossly normal  Consultants:    Procedures:    Data Reviewed: I have personally reviewed following labs and imaging studies Results for orders placed or performed during the [facility] encounter of 06/11/21 (from the past 24 hour(s))  Troponin I (High Sensitivity)     Status: None   Collection Time: 06/12/21  3:50 PM  Result Value Ref Range   Troponin I (High Sensitivity) 10 <18 ng/L  CBC with Differential/Platelet     Status: Abnormal   Collection Time: 06/13/21  1:11 AM  Result  Value Ref Range   WBC 9.0 4.0 - 10.5 K/uL   RBC 3.87 3.87 - 5.11 MIL/uL   Hemoglobin 12.7 12.0 - 15.0 g/dL   HCT 89.2 11.9 - 41.7 %   MCV 101.8 (H) 80.0 - 100.0 fL   MCH 32.8 26.0 - 34.0 pg   MCHC 32.2 30.0 - 36.0 g/dL   RDW 40.8 14.4 - 81.8 %   Platelets 157 150 - 400 K/uL   nRBC 0.0 0.0 - 0.2 %   Neutrophils Relative % 91 %   Neutro Abs 8.2 (H) 1.7 - 7.7 K/uL   Lymphocytes Relative 5 %   Lymphs Abs 0.4 (L) 0.7 - 4.0 K/uL   Monocytes Relative 3 %   Monocytes Absolute 0.3 0.1 - 1.0 K/uL   Eosinophils Relative 0 %   Eosinophils Absolute 0.0 0.0 - 0.5 K/uL   Basophils Relative 0 %   Basophils Absolute 0.0 0.0 - 0.1 K/uL   Immature Granulocytes 1 %   Abs Immature Granulocytes 0.06 0.00 - 0.07 K/uL  Comprehensive metabolic panel     Status: Abnormal   Collection Time: 06/13/21  1:11 AM  Result Value Ref Range   Sodium 141 135 - 145 mmol/L   Potassium 3.8 3.5 -  5.1 mmol/L   Chloride 100 98 - 111 mmol/L   CO2 30 22 - 32 mmol/L   Glucose, Bld 158 (H) 70 - 99 mg/dL   BUN 16 6 - 20 mg/dL   Creatinine, Ser 2.13 0.44 - 1.00 mg/dL   Calcium 6.9 (L) 8.9 - 10.3 mg/dL   Total Protein 5.8 (L) 6.5 - 8.1 g/dL   Albumin 2.6 (L) 3.5 - 5.0 g/dL   AST 16 15 - 41 U/L   ALT 14 0 - 44 U/L   Alkaline Phosphatase 59 38 - 126 U/L   Total Bilirubin 0.5 0.3 - 1.2 mg/dL   GFR, Estimated >08 >65 mL/min   Anion gap 11 5 - 15  C-reactive protein     Status: Abnormal   Collection Time: 06/13/21  1:11 AM  Result Value Ref Range   CRP 17.6 (H) <1.0 mg/dL  D-dimer, quantitative     Status: Abnormal   Collection Time: 06/13/21  1:11 AM  Result Value Ref Range   D-Dimer, Quant 1.38 (H) 0.00 - 0.50 ug/mL-FEU  Magnesium     Status: Abnormal   Collection Time: 06/13/21  1:11 AM  Result Value Ref Range   Magnesium 1.4 (L) 1.7 - 2.4 mg/dL    Recent Results (from the past 240 hour(s))  Resp Panel by RT-PCR (Flu A&B, Covid) Nasopharyngeal Swab     Status: Abnormal   Collection Time: 06/12/21  2:21 AM    Specimen: Nasopharyngeal Swab; Nasopharyngeal(NP) swabs in vial transport medium  Result Value Ref Range Status   SARS Coronavirus 2 by RT PCR POSITIVE (A) NEGATIVE Final    Comment: RESULT CALLED TO, READ BACK BY AND VERIFIED WITH: RN BOBBY SAGALANG  BY MESSAN H. AT 0348 ON 7 26 2022 (NOTE) SARS-CoV-2 target nucleic acids are DETECTED.  The SARS-CoV-2 RNA is generally detectable in upper respiratory specimens during the acute phase of infection. Positive results are indicative of the presence of the identified virus, but do not rule out bacterial infection or co-infection with other pathogens not detected by the test. Clinical correlation with patient history and other diagnostic information is necessary to determine patient infection status. The expected result is Negative.  Fact Sheet for Patients: BloggerCourse.com  Fact Sheet for Healthcare Providers: SeriousBroker.it  This test is not yet approved or cleared by the [place] FDA and  has been authorized for detection and/or diagnosis of SARS-CoV-2 by FDA under an Emergency Use Authorization (EUA).  This EUA will remain in effect (meani ng this test can be used) for the duration of  the COVID-19 declaration under Section 564(b)(1) of the Act, 21 U.S.C. section 360bbb-3(b)(1), unless the authorization is terminated or revoked sooner.     Influenza A by PCR NEGATIVE NEGATIVE Final   Influenza B by PCR NEGATIVE NEGATIVE Final    Comment: (NOTE) The Xpert Xpress SARS-CoV-2/FLU/RSV plus assay is intended as an aid in the diagnosis of influenza from Nasopharyngeal swab specimens and should not be used as a sole basis for treatment. Nasal washings and aspirates are unacceptable for Xpert Xpress SARS-CoV-2/FLU/RSV testing.  Fact Sheet for Patients: BloggerCourse.com  Fact Sheet for Healthcare  Providers: SeriousBroker.it  This test is not yet approved or cleared by the [place] FDA and has been authorized for detection and/or diagnosis of SARS-CoV-2 by FDA under an Emergency Use Authorization (EUA). This EUA will remain in effect (meaning this test can be used) for the duration of the COVID-19 declaration under Section 564(b)(1) of the Act,  21 U.S.C. section 360bbb-3(b)(1), unless the authorization is terminated or revoked.  Performed at [facility], [address], [place], [place] [address]      Radiology Studies: CT ABDOMEN PELVIS WO CONTRAST  Result Date: 06/12/2021 CLINICAL DATA:  Diffuse abdominal pain, distention, altered mental status EXAM: CT ABDOMEN AND PELVIS WITHOUT CONTRAST TECHNIQUE: Multidetector CT imaging of the abdomen and pelvis was performed following the standard protocol without IV contrast. COMPARISON:  None. FINDINGS: Lower chest: Large hiatal hernia. Heart is normal size. Patchy ground-glass opacities in the lingula and both lower lobes as well as right middle lobe could reflect areas of pneumonia. No effusions. Hepatobiliary: No focal liver abnormality is seen. Status post cholecystectomy. No biliary dilatation. Pancreas: No focal abnormality or ductal dilatation. Spleen: No focal abnormality.  Normal size. Adrenals/Urinary Tract: No adrenal abnormality. No focal renal abnormality. No stones or hydronephrosis. Urinary bladder is unremarkable. Stomach/Bowel: Stomach, large and small bowel grossly unremarkable. Vascular/Lymphatic: Aortic atherosclerosis. No evidence of aneurysm or adenopathy. Reproductive: Prior hysterectomy.  No adnexal masses. Other: No free fluid or free air. Musculoskeletal: No acute bony abnormality. IMPRESSION: Large hiatal hernia. Patchy ground-glass airspace opacities in the lung bases could reflect early multifocal pneumonia. Aortic atherosclerosis. No acute findings in the abdomen or pelvis.  Electronically Signed   By: Charlett Nose M.D.   On: 06/12/2021 10:15   DG Chest 2 View  Result Date: 06/12/2021 CLINICAL DATA:  Cough, altered mental status EXAM: CHEST - 2 VIEW COMPARISON:  None. FINDINGS: Diffuse interstitial opacity throughout the lungs with some mild airways thickening. No pneumothorax or effusion. The aorta is calcified. The remaining cardiomediastinal contours are unremarkable. Degenerative changes are present in the imaged spine and shoulders. No acute osseous or soft tissue abnormality. IMPRESSION: Diffuse interstitial opacities, could reflect acute or chronic interstitial changes and/or developing edema. Aortic Atherosclerosis (ICD10-I70.0). Electronically Signed   By: Kreg Shropshire M.D.   On: 06/12/2021 01:08   DG Tibia/Fibula Left  Result Date: 06/12/2021 CLINICAL DATA:  Left lower extremity swelling. EXAM: LEFT TIBIA AND FIBULA - 2 VIEW COMPARISON:  None. FINDINGS: The knee and ankle joints are maintained. The tibia and fibula are intact. IMPRESSION: No acute bony findings. Electronically Signed   By: Rudie Meyer M.D.   On: 06/12/2021 07:08   CT HEAD WO CONTRAST  Result Date: 06/12/2021 CLINICAL DATA:  60 year old female with altered mental status. EXAM: CT HEAD WITHOUT CONTRAST TECHNIQUE: Contiguous axial images were obtained from the base of the skull through the vertex without intravenous contrast. COMPARISON:  None. FINDINGS: Study is mildly degraded by motion artifact despite repeated imaging attempts. Brain: Cerebral volume is within normal limits for age. No midline shift, ventriculomegaly, mass effect, evidence of mass lesion, intracranial hemorrhage or evidence of cortically based acute infarction. Largely normal for age gray-white matter differentiation, minimal to mild scattered white matter hypodensity. No cortical encephalomalacia identified. Vascular: Calcified atherosclerosis at the skull base. No suspicious intracranial vascular hyperdensity. Skull: No acute  osseous abnormality identified. Sinuses/Orbits: Mild bilateral paranasal sinus mucosal thickening, relatively sparing the frontal sinuses. No sinus fluid level identified. Other: Bubbly opacity throughout the bilateral nasal cavity and nasopharynx. Tympanic cavities and mastoids are clear. Visualized orbit soft tissues are within normal limits. Visualized scalp soft tissues are within normal limits. IMPRESSION: 1. Mild motion artifact. Normal for age non contrast CT appearance of the brain. 2. Retained secretions in the nasopharynx and mild bilateral paranasal sinus inflammation. Electronically Signed   By: Althea Grimmer.D.  On: 06/12/2021 04:14   DG FEMUR MIN 2 VIEWS LEFT  Result Date: 06/12/2021 CLINICAL DATA:  Pain and bruising. History of cardiac catheterization 2 weeks ago. EXAM: LEFT FEMUR 2 VIEWS COMPARISON:  None. FINDINGS: The left hip is normally located. No fracture or AVN. The visualized left hemipelvic bony structures are intact. The left femur is intact. IMPRESSION: No acute bony findings. Electronically Signed   By: Rudie Meyer M.D.   On: 06/12/2021 07:07   CT ABDOMEN PELVIS WO CONTRAST  Final Result    DG FEMUR MIN 2 VIEWS LEFT  Final Result    DG Tibia/Fibula Left  Final Result    CT HEAD WO CONTRAST  Final Result    DG Chest 2 View  Final Result    VAS [place] LOWER EXTREMITY VENOUS (DVT)    (Results Pending)    Scheduled Meds:  vitamin C  500 mg Oral Daily   aspirin EC  81 mg Oral Daily   atorvastatin  40 mg Oral QHS   clopidogrel  75 mg Oral Daily   dextromethorphan-guaiFENesin  1 tablet Oral BID   [START ON 06/14/2021] enoxaparin (LOVENOX) injection  40 mg Subcutaneous QHS   ferrous sulfate  325 mg Oral Q breakfast   fluticasone furoate-vilanterol  1 puff Inhalation Daily   methylPREDNISolone (SOLU-MEDROL) injection  60 mg Intravenous BID   multivitamin with minerals  1 tablet Oral Daily   pregabalin  200 mg Oral TID   umeclidinium bromide  1 puff Inhalation Daily    vitamin B-12  1,000 mcg Oral Daily   zinc sulfate  220 mg Oral Daily   PRN Meds: acetaminophen **OR** acetaminophen Continuous Infusions:  magnesium sulfate bolus IVPB 4 g (06/13/21 1124)   remdesivir 100 mg in NS 100 mL 100 mg (06/13/21 1036)     LOS: 1 day  Time spent: Greater than 50% of the 35 minute visit was spent in counseling/coordination of care for the patient as laid out in the A&P.   Lewie Chamber, MD [facility] 06/13/2021, 11:33 AM

## 2021-06-14 DIAGNOSIS — J9621 Acute and chronic respiratory failure with hypoxia: Secondary | ICD-10-CM

## 2021-06-14 DIAGNOSIS — J1282 Pneumonia due to coronavirus disease 2019: Secondary | ICD-10-CM

## 2021-06-14 DIAGNOSIS — U071 COVID-19: Secondary | ICD-10-CM | POA: Diagnosis not present

## 2021-06-14 DIAGNOSIS — J449 Chronic obstructive pulmonary disease, unspecified: Secondary | ICD-10-CM

## 2021-06-14 LAB — COMPREHENSIVE METABOLIC PANEL
ALT: 14 U/L (ref 0–44)
AST: 16 U/L (ref 15–41)
Albumin: 2.5 g/dL — ABNORMAL LOW (ref 3.5–5.0)
Alkaline Phosphatase: 61 U/L (ref 38–126)
Anion gap: 6 (ref 5–15)
BUN: 14 mg/dL (ref 6–20)
CO2: 31 mmol/L (ref 22–32)
Calcium: 7.3 mg/dL — ABNORMAL LOW (ref 8.9–10.3)
Chloride: 98 mmol/L (ref 98–111)
Creatinine, Ser: 0.73 mg/dL (ref 0.44–1.00)
GFR, Estimated: >60 mL/min (ref >60)
Glucose, Bld: 180 mg/dL — ABNORMAL HIGH (ref 70–99)
Potassium: 3.9 mmol/L (ref 3.5–5.1)
Sodium: 135 mmol/L (ref 135–145)
Total Bilirubin: 0.6 mg/dL (ref 0.3–1.2)
Total Protein: 5.5 g/dL — ABNORMAL LOW (ref 6.5–8.1)

## 2021-06-14 LAB — CBC WITH DIFFERENTIAL/PLATELET
Abs Immature Granulocytes: 0.08 10*3/uL — ABNORMAL HIGH (ref 0.00–0.07)
Basophils Absolute: 0 10*3/uL (ref 0.0–0.1)
Basophils Relative: 0 %
Eosinophils Absolute: 0 10*3/uL (ref 0.0–0.5)
Eosinophils Relative: 0 %
HCT: 37.5 % (ref 36.0–46.0)
Hemoglobin: 12.1 g/dL (ref 12.0–15.0)
Immature Granulocytes: 1 %
Lymphocytes Relative: 4 %
Lymphs Abs: 0.5 10*3/uL — ABNORMAL LOW (ref 0.7–4.0)
MCH: 33.3 pg (ref 26.0–34.0)
MCHC: 32.3 g/dL (ref 30.0–36.0)
MCV: 103.3 fL — ABNORMAL HIGH (ref 80.0–100.0)
Monocytes Absolute: 0.4 10*3/uL (ref 0.1–1.0)
Monocytes Relative: 4 %
Neutro Abs: 10.4 10*3/uL — ABNORMAL HIGH (ref 1.7–7.7)
Neutrophils Relative %: 91 %
Platelets: 142 10*3/uL — ABNORMAL LOW (ref 150–400)
RBC: 3.63 MIL/uL — ABNORMAL LOW (ref 3.87–5.11)
RDW: 13.6 % (ref 11.5–15.5)
WBC: 11.5 10*3/uL — ABNORMAL HIGH (ref 4.0–10.5)
nRBC: 0 % (ref 0.0–0.2)

## 2021-06-14 LAB — D-DIMER, QUANTITATIVE: D-Dimer, Quant: 1.48 ug/mL-FEU — ABNORMAL HIGH (ref 0.00–0.50)

## 2021-06-14 LAB — MAGNESIUM: Magnesium: 2 mg/dL (ref 1.7–2.4)

## 2021-06-14 LAB — C-REACTIVE PROTEIN: CRP: 9.6 mg/dL — ABNORMAL HIGH (ref <1.0)

## 2021-06-14 MED ORDER — NICOTINE 21 MG/24HR TD PT24
21.0000 mg | MEDICATED_PATCH | Freq: Every day | TRANSDERMAL | Status: DC
  Administered 2021-06-14 – 2021-06-16 (×3): 21 mg via TRANSDERMAL
  Filled 2021-06-14 (×3): qty 1

## 2021-06-14 NOTE — Progress Notes (Signed)
Progress Note    Kim Greene   ZOX:[id number]  DOB: 1961/05/31  DOA: 06/11/2021     2  PCP: Patient, No Pcp Per (Inactive)  CC: SOB, fever, cough  Hospital Course: Kim Greene is a 60 yo female with PMH COPD, chronic pain and also on Suboxone was recently admitted at the hospital in [facility] what looks like could be an NSTEMI when patient underwent cardiac cath and was told to take aspirin Plavix and statins which are new at the time patient also was placed on Z-Pak and steroids was discharged about 4 days PTA, she had come to [facility] to help her daughter to move back to her [place].   Evening prior to admission, she went with her son for shopping at [employer] patient appeared confused and was having some strange smell and later on passed out on the floor.  She did regain consciousness soon but was brought to the ER.  Did not complain of any chest pain but did have shortness of breath.   After her discharge from the hospital 4 days ago she was found to have bruising on the left lower extremity and left lower quadrant of the abdomen.  Denies any abdominal pain.  Patient usually takes Vyvanse but hospital doctor at discharge advised her to stop taking Vyvanse due to her cardiac issues she had at the hospital.   ED Course: In the ER patient was confused initially requiring 100% nonrebreather presently on 5 L.  Chest x-ray shows bilateral infiltrates COVID test came back positive patient also was febrile with temperature of 101.7.  CT head is unremarkable.  EKG shows normal sinus rhythm with nonspecific ST-T changes.  Patient is started on Decadron remdesivir admitted for acute hypoxic respiratory failure secondary to COVID infection. She confirmed with her son present in the ER that she wished to be a DNR/DNI.  Interval History:   No significant issues overnight, still reports some cough, denies any fever or chills.  Assessment & Plan:   Pneumonia due  to COVID-19 virus - At risk for further decompensation with underlying newly diagnosed CAD.   -Continue remdesivir, to finish 5 days -Continue with IV steroids due to hypoxia, Decadron has been changed to Solu-Medrol - CODE STATUS confirmed in the ER, she elects for DNR/DNI, her son was bedside as witness for this conversation -Was encouraged use incentive spirometry, flutter valve, she was encouraged to get out of bed to chair .   Acute on chronic respiratory failure with hypoxia (HCC) - On 2 L chronically at home but has been noncompliant recently - Continues to smoke, 1-2 PPD/day -Continue O2 and wean as able -Acute worsening considered due to underlying COVID infection as well as probable COPD exacerbation   Left leg swelling - Scattered bruises on left lower extremity.  Unclear etiology - Doppler with no evidence of DVT, but likely related to ruptured Baker's cyst.  Versus hematoma - No pain, erythema   Bruise - large abdominal bruise noted on admission  - No acute abnormality on CT abdomen/pelvis; large hiatal hernia noted - Possibly from previous Lovenox injections while at previous hospital - Bruise remained stable in size and appearance   CAD (coronary artery disease) - Records requested from [facility] where she was there recently for possible NSTEMI - Continue aspirin, Plavix, statin   Macrocytosis without anemia - Check iron studies (low, started on oral iron) - folate 8.5, started MVI - B12 = 86 (started on oral B12;  no neuropathic complaints)   Hypokalemia -replaced, continue to monitor closely   COPD (chronic obstructive pulmonary disease) (HCC) - continue steroids and breathing treatments    HLD (hyperlipidemia) - continue lipitor      Antimicrobials: Remdesivir 7/26 >> current  DVT prophylaxis: enoxaparin (LOVENOX) injection 40 mg Start: 06/14/21 2200   Code Status:   Code Status: DNR Family Communication: Husband at bedside  Disposition  Plan: Status is: Inpatient  Remains inpatient appropriate because:IV treatments appropriate due to intensity of illness or inability to take PO and Inpatient level of care appropriate due to severity of illness  Dispo: The patient is from: Home              Anticipated d/c is to: Home              Patient currently is not medically stable to d/c.   Difficult to place patient No  Risk of unplanned readmission score: Unplanned Admission- Pilot do not use: 14.53   Objective: Blood pressure (!) 106/53, pulse (!) 56, temperature 97.7 F (36.5 C), temperature source Oral, resp. rate 14, SpO2 95 %.  Examination:  Awake Alert, Oriented X 3, No new F.N deficits, Normal affect Symmetrical Chest wall movement, Good air movement bilaterally, CTAB RRR,No Gallops,Rubs or new Murmurs, No Parasternal Heave +ve B.Sounds, Abd Soft, No tenderness, No rebound - guarding or rigidity. No Cyanosis, Clubbing or edema, No new Rash or bruise     Consultants:  None  Data Reviewed: I have personally reviewed following labs and imaging studies Results for orders placed or performed during the hospital encounter of 06/11/21 (from the past 24 hour(s))  CBC with Differential/Platelet     Status: Abnormal   Collection Time: 06/14/21  1:02 AM  Result Value Ref Range   WBC 11.5 (H) 4.0 - 10.5 K/uL   RBC 3.63 (L) 3.87 - 5.11 MIL/uL   Hemoglobin 12.1 12.0 - 15.0 g/dL   HCT 02.4 09.7 - 35.3 %   MCV 103.3 (H) 80.0 - 100.0 fL   MCH 33.3 26.0 - 34.0 pg   MCHC 32.3 30.0 - 36.0 g/dL   RDW 29.9 24.2 - 68.3 %   Platelets 142 (L) 150 - 400 K/uL   nRBC 0.0 0.0 - 0.2 %   Neutrophils Relative % 91 %   Neutro Abs 10.4 (H) 1.7 - 7.7 K/uL   Lymphocytes Relative 4 %   Lymphs Abs 0.5 (L) 0.7 - 4.0 K/uL   Monocytes Relative 4 %   Monocytes Absolute 0.4 0.1 - 1.0 K/uL   Eosinophils Relative 0 %   Eosinophils Absolute 0.0 0.0 - 0.5 K/uL   Basophils Relative 0 %   Basophils Absolute 0.0 0.0 - 0.1 K/uL   Immature  Granulocytes 1 %   Abs Immature Granulocytes 0.08 (H) 0.00 - 0.07 K/uL  Comprehensive metabolic panel     Status: Abnormal   Collection Time: 06/14/21  1:02 AM  Result Value Ref Range   Sodium 135 135 - 145 mmol/L   Potassium 3.9 3.5 - 5.1 mmol/L   Chloride 98 98 - 111 mmol/L   CO2 31 22 - 32 mmol/L   Glucose, Bld 180 (H) 70 - 99 mg/dL   BUN 14 6 - 20 mg/dL   Creatinine, Ser 4.19 0.44 - 1.00 mg/dL   Calcium 7.3 (L) 8.9 - 10.3 mg/dL   Total Protein 5.5 (L) 6.5 - 8.1 g/dL   Albumin 2.5 (L) 3.5 - 5.0 g/dL   AST 16 15 -  41 U/L   ALT 14 0 - 44 U/L   Alkaline Phosphatase 61 38 - 126 U/L   Total Bilirubin 0.6 0.3 - 1.2 mg/dL   GFR, Estimated >15 >40 mL/min   Anion gap 6 5 - 15  C-reactive protein     Status: Abnormal   Collection Time: 06/14/21  1:02 AM  Result Value Ref Range   CRP 9.6 (H) <1.0 mg/dL  D-dimer, quantitative     Status: Abnormal   Collection Time: 06/14/21  1:02 AM  Result Value Ref Range   D-Dimer, Quant 1.48 (H) 0.00 - 0.50 ug/mL-FEU  Magnesium     Status: None   Collection Time: 06/14/21  1:02 AM  Result Value Ref Range   Magnesium 2.0 1.7 - 2.4 mg/dL    Recent Results (from the past 240 hour(s))  Resp Panel by RT-PCR (Flu A&B, Covid) Nasopharyngeal Swab     Status: Abnormal   Collection Time: 06/12/21  2:21 AM   Specimen: Nasopharyngeal Swab; Nasopharyngeal(NP) swabs in vial transport medium  Result Value Ref Range Status   SARS Coronavirus 2 by RT PCR POSITIVE (A) NEGATIVE Final    Comment: RESULT CALLED TO, READ BACK BY AND VERIFIED WITH: RN BOBBY SAGALANG  BY MESSAN H. AT 0348 ON 7 26 2022 (NOTE) SARS-CoV-2 target nucleic acids are DETECTED.  The SARS-CoV-2 RNA is generally detectable in upper respiratory specimens during the acute phase of infection. Positive results are indicative of the presence of the identified virus, but do not rule out bacterial infection or co-infection with other pathogens not detected by the test. Clinical correlation with  patient history and other diagnostic information is necessary to determine patient infection status. The expected result is Negative.  Fact Sheet for Patients: BloggerCourse.com  Fact Sheet for Healthcare Providers: SeriousBroker.it  This test is not yet approved or cleared by the [place] FDA and  has been authorized for detection and/or diagnosis of SARS-CoV-2 by FDA under an Emergency Use Authorization (EUA).  This EUA will remain in effect (meani ng this test can be used) for the duration of  the COVID-19 declaration under Section 564(b)(1) of the Act, 21 U.S.C. section 360bbb-3(b)(1), unless the authorization is terminated or revoked sooner.     Influenza A by PCR NEGATIVE NEGATIVE Final   Influenza B by PCR NEGATIVE NEGATIVE Final    Comment: (NOTE) The Xpert Xpress SARS-CoV-2/FLU/RSV plus assay is intended as an aid in the diagnosis of influenza from Nasopharyngeal swab specimens and should not be used as a sole basis for treatment. Nasal washings and aspirates are unacceptable for Xpert Xpress SARS-CoV-2/FLU/RSV testing.  Fact Sheet for Patients: BloggerCourse.com  Fact Sheet for Healthcare Providers: SeriousBroker.it  This test is not yet approved or cleared by the [place] FDA and has been authorized for detection and/or diagnosis of SARS-CoV-2 by FDA under an Emergency Use Authorization (EUA). This EUA will remain in effect (meaning this test can be used) for the duration of the COVID-19 declaration under Section 564(b)(1) of the Act, 21 U.S.C. section 360bbb-3(b)(1), unless the authorization is terminated or revoked.  Performed at [facility], [address], [place], [place] [address]      Radiology Studies: VAS [place] LOWER EXTREMITY VENOUS (DVT)  Result Date: 06/14/2021  Lower Venous DVT Study Patient Name:  CARLISE STOFER  Date of Exam:    06/13/2021 Medical Rec #: [id number]      Accession #:    [id number] Date of Birth: [birth year]  Patient Gender: F Patient Age:   [age]Y [facility] Procedure:      VAS US LOWER EXTREMITY VENOUS (DVT) Referring Phys: [id number] DAVID GIRGUIS --------------------------------------------------------------------------------  Indications: Swelling and brusing to LLE.  Comparison Study: No previous exams Performing Technologist: Jody Hill RVT, RDMS  Examination Guidelines: A complete evaluation includes B-mode imaging, spectral Doppler, color Doppler, and power Doppler as needed of all accessible portions of each vessel. Bilateral testing is considered an integral part of a complete examination. Limited examinations for reoccurring indications may be performed as noted. The reflux portion of the exam is performed with the patient in reverse Trendelenburg.  +-----+---------------+---------+-----------+----------+--------------+ RIGHTCompressibilityPhasicitySpontaneityPropertiesThrombus Aging +-----+---------------+---------+-----------+----------+--------------+ CFV  Full           Yes      Yes                                 +-----+---------------+---------+-----------+----------+--------------+   +---------+---------------+---------+-----------+----------+--------------+ LEFT     CompressibilityPhasicitySpontaneityPropertiesThrombus Aging +---------+---------------+---------+-----------+----------+--------------+ CFV      Full           Yes      Yes                                 +---------+---------------+---------+-----------+----------+--------------+ SFJ      Full                                                        +---------+---------------+---------+-----------+----------+--------------+ FV Prox  Full           Yes      Yes                                 +---------+---------------+---------+-----------+----------+--------------+ FV Mid   Full            Yes      Yes                                 +---------+---------------+---------+-----------+----------+--------------+ FV DistalFull           Yes      Yes                                 +---------+---------------+---------+-----------+----------+--------------+ PFV      Full                                                        +---------+---------------+---------+-----------+----------+--------------+ POP      Full           Yes      Yes                                 +---------+---------------+---------+-----------+----------+--------------+ PTV      Full                                                        +---------+---------------+---------+-----------+----------+--------------+  PERO     Full                                                        +---------+---------------+---------+-----------+----------+--------------+     Summary: RIGHT: - No evidence of common femoral vein obstruction.  LEFT: - There is no evidence of deep vein thrombosis in the lower extremity. - There is no evidence of superficial venous thrombosis.  - Ultrasound characteristics of a ruptured Baker's Cyst vs hematoma are noted.  *See table(s) above for measurements and observations. Electronically signed by Lemar Livings MD on 06/14/2021 at 2:49:32 PM.    Final    VAS [place] LOWER EXTREMITY VENOUS (DVT)  Final Result    CT ABDOMEN PELVIS WO CONTRAST  Final Result    DG FEMUR MIN 2 VIEWS LEFT  Final Result    DG Tibia/Fibula Left  Final Result    CT HEAD WO CONTRAST  Final Result    DG Chest 2 View  Final Result      Scheduled Meds:  vitamin C  500 mg Oral Daily   aspirin EC  81 mg Oral Daily   atorvastatin  40 mg Oral QHS   buprenorphine-naloxone  1 tablet Sublingual BID   clopidogrel  75 mg Oral Daily   dextromethorphan-guaiFENesin  1 tablet Oral BID   enoxaparin (LOVENOX) injection  40 mg Subcutaneous QHS   ferrous sulfate  325 mg Oral Q breakfast   fluticasone  furoate-vilanterol  1 puff Inhalation Daily   methylPREDNISolone (SOLU-MEDROL) injection  60 mg Intravenous BID   multivitamin with minerals  1 tablet Oral Daily   nicotine  21 mg Transdermal Daily   pregabalin  200 mg Oral TID   umeclidinium bromide  1 puff Inhalation Daily   vitamin B-12  1,000 mcg Oral Daily   zinc sulfate  220 mg Oral Daily   PRN Meds: acetaminophen **OR** acetaminophen Continuous Infusions:  remdesivir 100 mg in NS 100 mL 100 mg (06/14/21 1000)     LOS: 2 days  Time spent: Greater than 50% of the 35 minute visit was spent in counseling/coordination of care for the patient as laid out in the A&P.   Huey Bienenstock, MD [facility] 06/14/2021, 3:21 PM

## 2021-06-14 NOTE — Progress Notes (Addendum)
Pt sleeping most the day, noted to be very drowsy this pm but arouses easily. O2 cont @ 3L/min via [place]. Pt using IS & flutter valve @ intervals with encouragement from staff. Pt cont to have productive cough at intervals. No resp distress noted.

## 2021-06-14 NOTE — Evaluation (Addendum)
Physical Therapy Evaluation Patient Details Name: Kim Greene MRN: [id number] DOB: 1960-12-27 Today's Date: 06/14/2021   History of Present Illness  Pt brought to ED on 06/11/21 d/t confusion, c/o strange smell and syncope. Admitted for acute hypoxic respiratory failure secondary to COVID infection.  PMH includes COPD, chronic pain and also on Suboxone. Pt recently d/c from hospital in [facility] with possible nonestrogen MI when patient underwent cardiac cath.  Clinical Impression  Pt was able to get up a second time today (first time with OT) and move around the room.  For me, it did not make any difference if she was on or off of O2.  She walked without and was lowest 87% on RA, but hovered mostly around 88%, and with 2 L O2 [place] she was 87% during gait.  I did not try higher O2 as she was fatigued and ready to sit down. She was on 3 L O2 [place] at the beginning at rest and was in the mid 90s. She has been sitting EOB most of the day, so we sat in the recliner chair to give her back a break at the end of our session.  She has been getting herself to the bathroom without difficulty.  PT to follow acutely for deficits listed below.     Follow Up Recommendations No PT follow up    Equipment Recommendations  None recommended by PT    Recommendations for Other Services       Precautions / Restrictions Precautions Precautions: Other (comment) Precaution Comments: monitor O2 sats Restrictions Weight Bearing Restrictions: No      Mobility  Bed Mobility Overal bed mobility: Modified Independent             General bed mobility comments: extra time and effort. no physical assist    Transfers Overall transfer level: Needs assistance Equipment used: None Transfers: Sit to/from Stand Sit to Stand: Supervision         General transfer comment: to/from EOB. supervision for safety and line management  Ambulation/Gait Ambulation/Gait assistance: Supervision Gait Distance (Feet): 60  Feet Assistive device: None Gait Pattern/deviations: Step-through pattern;Staggering right;Staggering left   Gait velocity interpretation: 1.31 - 2.62 ft/sec, indicative of limited community ambulator General Gait Details: Pt with mildly staggering gait pattern, but able to self correct.  Soft BPs low 100s/60s, but remained the same throughout. O2 sats were lowest 87% on RA during gait and with 2 L O2 [place] during gait, so not sure if O2 is making a difference.  Stairs            Wheelchair Mobility    Modified Rankin (Stroke Patients Only)       Balance Overall balance assessment: Mild deficits observed, not formally tested                                           Pertinent Vitals/Pain Pain Assessment: Faces Faces Pain Scale: Hurts little more Pain Location: grimacing while coughing Pain Descriptors / Indicators: Grimacing Pain Intervention(s): Limited activity within patient's tolerance;Monitored during session;Repositioned    Home Living Family/patient expects to be discharged to:: Private residence (camper) Living Arrangements: Alone Available Help at Discharge: Family;Available PRN/intermittently;Other (Comment) (ex-husband and son live close by) Type of Home: Other(Comment) (camper) Home Access: Stairs to enter Entrance Stairs-Rails: None Entrance Stairs-Number of Steps: 3 Home Layout: One level Home Equipment: Information systems manager  Prior Function Level of Independence: Independent         Comments: does not use AD at at baseline.     Hand Dominance        Extremity/Trunk Assessment   Upper Extremity Assessment Upper Extremity Assessment: Defer to OT evaluation    Lower Extremity Assessment Lower Extremity Assessment: Generalized weakness (at least 3/5 per gross functional and seated assessment.)    Cervical / Trunk Assessment Cervical / Trunk Assessment: Kyphotic (rounded shoulders)  Communication   Communication: No  difficulties  Cognition Arousal/Alertness: Awake/alert Behavior During Therapy: WFL for tasks assessed/performed Overall Cognitive Status: Within Functional Limits for tasks assessed                                 General Comments: Pt self reports feeling foggy, we discussed COVID fog and agreed she was significantly better than what was described on admission.      General Comments General comments (skin integrity, edema, etc.): Ex husband present and supportive during session.    Exercises General Exercises - Upper Extremity Shoulder Flexion: AROM;Both;10 reps;Seated General Exercises - Lower Extremity Long Arc Quad: AROM;Both;10 reps;Seated Toe Raises: AROM;Both;20 reps Heel Raises: AROM;Both;20 reps Other Exercises Other Exercises: 10x incentive spirometer sitting EOB, cues for trunk position and technique as well as hourly frequency   Assessment/Plan    PT Assessment Patient needs continued PT services  PT Problem List Decreased strength;Decreased activity tolerance;Decreased balance;Decreased mobility;Cardiopulmonary status limiting activity       PT Treatment Interventions DME instruction;Gait training;Stair training;Functional mobility training;Therapeutic exercise;Therapeutic activities;Balance training;Patient/family education    PT Goals (Current goals can be found in the Care Plan section)  Acute Rehab PT Goals Patient Stated Goal: get better, go home PT Goal Formulation: With patient Time For Goal Achievement: 06/28/21 Potential to Achieve Goals: Good    Frequency Min 3X/week   Barriers to discharge        Co-evaluation               AM-PAC PT "6 Clicks" Mobility  Outcome Measure Help needed turning from your back to your side while in a flat bed without using bedrails?: A Little Help needed moving from lying on your back to sitting on the side of a flat bed without using bedrails?: A Little Help needed moving to and from a bed to a  chair (including a wheelchair)?: A Little Help needed standing up from a chair using your arms (e.g., wheelchair or bedside chair)?: A Little Help needed to walk in hospital room?: A Little Help needed climbing 3-5 steps with a railing? : A Little 6 Click Score: 18    End of Session   Activity Tolerance: Patient limited by fatigue Patient left: in chair;with call bell/phone within reach   PT Visit Diagnosis: Muscle weakness (generalized) (M62.81);Difficulty in walking, not elsewhere classified (R26.2)    Time: 4098-1191 PT Time Calculation (min) (ACUTE ONLY): 39 min   Charges:   PT Evaluation $PT Eval Moderate Complexity: 1 Mod PT Treatments $Gait Training: 8-22 mins $Therapeutic Activity: 8-22 mins      Corinna Capra, PT, DPT  Acute Rehabilitation Ortho Tech Supervisor [phone] pager [phone]) [phone] office

## 2021-06-14 NOTE — Evaluation (Signed)
Occupational Therapy Evaluation Patient Details Name: Kim Greene MRN: [id number] DOB: 02-04-1961 Today's Date: 06/14/2021    History of Present Illness Pt brought to ED d/t confusion, c/o strange smell and syncope. Admitted for acute hypoxic respiratory failure secondary to COVID infection.  PMH includes COPD, chronic pain and also on Suboxone. [facility] with possible nonestrogen MI when patient underwent cardiac cath.   Clinical Impression   Pt admitted with the above diagnoses and presents with below problem list. Pt will benefit from continued acute OT to address the below listed deficits and maximize independence with basic ADLs prior to d/c home. At baseline, pt is independent with ADLs. Pt currently able to walk in the room at supervision level, assist only for managing O2 line and tank. Pt currently needs up to min guard assist with LB ADLs. Discussed being out of bed to recliner for meals and/or sitting EOB at intervals throughout the day. Pt on 3L O2 throughout session with Sp O2 >94. HR 53 at start of session, 67 at end of session.      Follow Up Recommendations  Home health OT;Supervision - Intermittent    Equipment Recommendations  None recommended by OT    Recommendations for Other Services PT consult     Precautions / Restrictions Restrictions Weight Bearing Restrictions: No      Mobility Bed Mobility Overal bed mobility: Modified Independent             General bed mobility comments: extra time and effort. no physical assist    Transfers Overall transfer level: Needs assistance Equipment used: None Transfers: Sit to/from Stand Sit to Stand: Supervision         General transfer comment: to/from EOB. supervision for safety    Balance Overall balance assessment: Mild deficits observed, not formally tested                                         ADL either performed or assessed with clinical judgement    ADL Overall ADL's : Needs assistance/impaired Eating/Feeding: Set up;Sitting   Grooming: Set up;Sitting   Upper Body Bathing: Set up;Sitting   Lower Body Bathing: Min guard;Sit to/from stand   Upper Body Dressing : Set up;Sitting   Lower Body Dressing: Min guard;Sit to/from stand   Toilet Transfer: Supervision/safety;Stand-pivot;Ambulation;BSC   Toileting- Clothing Manipulation and Hygiene: Set up;Supervision/safety;Sit to/from stand;Sitting/lateral lean       Functional mobility during ADLs: Supervision/safety General ADL Comments: Pt completed household distance funcitonal mobility (4 laps in the room). Extra time and effort, short pauses 2-3x d/t onset of coughing. On 3L with sat >94. Encouraged sitting EOB some during the day and/or up to recliner for meals.     Vision         Perception     Praxis      Pertinent Vitals/Pain Pain Assessment: Faces Faces Pain Scale: Hurts little more Pain Location: grimacing while coughing Pain Descriptors / Indicators: Grimacing Pain Intervention(s): Limited activity within patient's tolerance;Monitored during session;Repositioned;Other (comment) (encouraged incentive spirometer)     Hand Dominance     Extremity/Trunk Assessment Upper Extremity Assessment Upper Extremity Assessment: Generalized weakness;Overall [facility] for tasks assessed   Lower Extremity Assessment Lower Extremity Assessment: Defer to PT evaluation   Cervical / Trunk Assessment Cervical / Trunk Assessment: Kyphotic (rounded shoulders)   Communication Communication Communication: No difficulties  Cognition Arousal/Alertness: Awake/alert Behavior During Therapy: WFL for tasks assessed/performed Overall Cognitive Status: Within Functional Limits for tasks assessed                                     General Comments  Ex husband present and supportive during session.    Exercises Exercises: Other exercises Other Exercises Other  Exercises: 10x incentive spirometer sitting EOB, cues for trunk position and technique   Shoulder Instructions      Home Living Family/patient expects to be discharged to:: Private residence (camper) Living Arrangements: Alone Available Help at Discharge: Family;Available PRN/intermittently;Other (Comment) (ex-husband and son live close by) Type of Home: Other(Comment) (camper) Home Access: Stairs to enter Entrance Stairs-Number of Steps: 3 Entrance Stairs-Rails: None Home Layout: One level     Bathroom Shower/Tub: Tub/shower unit         Home Equipment: Shower seat          Prior Functioning/Environment Level of Independence: Independent        Comments: does not use AD at at baseline.        OT Problem List: Decreased strength;Decreased activity tolerance;Impaired balance (sitting and/or standing);Decreased knowledge of use of DME or AE;Decreased knowledge of precautions;Cardiopulmonary status limiting activity;Pain      OT Treatment/Interventions: Self-care/ADL training;Therapeutic exercise;Energy conservation;DME and/or AE instruction;Therapeutic activities;Patient/family education;Balance training    OT Goals(Current goals can be found in the care plan section) Acute Rehab OT Goals Patient Stated Goal: get better, go home OT Goal Formulation: With patient Time For Goal Achievement: 06/28/21 Potential to Achieve Goals: Good ADL Goals Pt Will Perform Grooming: with supervision;standing Pt Will Perform Lower Body Bathing: with supervision;sit to/from stand Pt Will Perform Lower Body Dressing: with supervision;sit to/from stand Pt Will Transfer to Toilet: with modified independence;ambulating Pt Will Perform Toileting - Clothing Manipulation and hygiene: Independently;sitting/lateral leans;sit to/from stand  OT Frequency: Min 2X/week   Barriers to D/C:            Co-evaluation              AM-PAC OT "6 Clicks" Daily Activity     Outcome Measure Help  from another person eating meals?: None Help from another person taking care of personal grooming?: A Little Help from another person toileting, which includes using toliet, bedpan, or urinal?: A Little Help from another person bathing (including washing, rinsing, drying)?: A Little Help from another person to put on and taking off regular upper body clothing?: None Help from another person to put on and taking off regular lower body clothing?: A Little 6 Click Score: 20   End of Session Equipment Utilized During Treatment: Oxygen (3L) Nurse Communication: Other (comment) (RN in the room during OT eval)  Activity Tolerance: Patient tolerated treatment well Patient left: in bed;with call bell/phone within reach;with family/visitor present;Other (comment);with nursing/sitter in room (sitting EOB)  OT Visit Diagnosis: Unsteadiness on feet (R26.81);Muscle weakness (generalized) (M62.81);Pain                Time: 3154-0086 OT Time Calculation (min): 33 min Charges:  OT General Charges $OT Visit: 1 Visit OT Evaluation $OT Eval Moderate Complexity: 1 Mod OT Treatments $Self Care/Home Management : 8-22 mins  Raynald Kemp, OT Acute Rehabilitation Services Pager: [phone] Office: [phone]   Pilar Grammes 06/14/2021, 1:03 PM

## 2021-06-15 DIAGNOSIS — U071 COVID-19: Secondary | ICD-10-CM | POA: Diagnosis not present

## 2021-06-15 DIAGNOSIS — J1282 Pneumonia due to coronavirus disease 2019: Secondary | ICD-10-CM | POA: Diagnosis not present

## 2021-06-15 DIAGNOSIS — J9621 Acute and chronic respiratory failure with hypoxia: Secondary | ICD-10-CM | POA: Diagnosis not present

## 2021-06-15 LAB — CBC WITH DIFFERENTIAL/PLATELET
Abs Immature Granulocytes: 0.06 10*3/uL (ref 0.00–0.07)
Basophils Absolute: 0 10*3/uL (ref 0.0–0.1)
Basophils Relative: 0 %
Eosinophils Absolute: 0 10*3/uL (ref 0.0–0.5)
Eosinophils Relative: 0 %
HCT: 35.9 % — ABNORMAL LOW (ref 36.0–46.0)
Hemoglobin: 11.4 g/dL — ABNORMAL LOW (ref 12.0–15.0)
Immature Granulocytes: 1 %
Lymphocytes Relative: 6 %
Lymphs Abs: 0.5 10*3/uL — ABNORMAL LOW (ref 0.7–4.0)
MCH: 32.9 pg (ref 26.0–34.0)
MCHC: 31.8 g/dL (ref 30.0–36.0)
MCV: 103.5 fL — ABNORMAL HIGH (ref 80.0–100.0)
Monocytes Absolute: 0.2 10*3/uL (ref 0.1–1.0)
Monocytes Relative: 3 %
Neutro Abs: 7.8 10*3/uL — ABNORMAL HIGH (ref 1.7–7.7)
Neutrophils Relative %: 90 %
Platelets: 157 10*3/uL (ref 150–400)
RBC: 3.47 MIL/uL — ABNORMAL LOW (ref 3.87–5.11)
RDW: 13.7 % (ref 11.5–15.5)
WBC: 8.6 10*3/uL (ref 4.0–10.5)
nRBC: 0 % (ref 0.0–0.2)

## 2021-06-15 LAB — MAGNESIUM: Magnesium: 1.9 mg/dL (ref 1.7–2.4)

## 2021-06-15 LAB — COMPREHENSIVE METABOLIC PANEL
ALT: 14 U/L (ref 0–44)
AST: 15 U/L (ref 15–41)
Albumin: 2.4 g/dL — ABNORMAL LOW (ref 3.5–5.0)
Alkaline Phosphatase: 61 U/L (ref 38–126)
Anion gap: 4 — ABNORMAL LOW (ref 5–15)
BUN: 15 mg/dL (ref 6–20)
CO2: 34 mmol/L — ABNORMAL HIGH (ref 22–32)
Calcium: 7.7 mg/dL — ABNORMAL LOW (ref 8.9–10.3)
Chloride: 101 mmol/L (ref 98–111)
Creatinine, Ser: 0.65 mg/dL (ref 0.44–1.00)
GFR, Estimated: >60 mL/min (ref >60)
Glucose, Bld: 138 mg/dL — ABNORMAL HIGH (ref 70–99)
Potassium: 4.4 mmol/L (ref 3.5–5.1)
Sodium: 139 mmol/L (ref 135–145)
Total Bilirubin: 0.5 mg/dL (ref 0.3–1.2)
Total Protein: 5.4 g/dL — ABNORMAL LOW (ref 6.5–8.1)

## 2021-06-15 LAB — D-DIMER, QUANTITATIVE: D-Dimer, Quant: 1.18 ug/mL-FEU — ABNORMAL HIGH (ref 0.00–0.50)

## 2021-06-15 LAB — C-REACTIVE PROTEIN: CRP: 6.4 mg/dL — ABNORMAL HIGH (ref <1.0)

## 2021-06-15 MED ORDER — PANTOPRAZOLE SODIUM 40 MG PO TBEC
40.0000 mg | DELAYED_RELEASE_TABLET | Freq: Every day | ORAL | Status: DC
  Administered 2021-06-15 – 2021-06-16 (×2): 40 mg via ORAL
  Filled 2021-06-15 (×2): qty 1

## 2021-06-15 NOTE — Progress Notes (Signed)
Occupational Therapy Treatment Patient Details Name: Kim Greene MRN: [id number] DOB: December 23, 1960 Today's Date: 06/15/2021    History of present illness Pt brought to ED on 06/11/21 d/t confusion, c/o strange smell and syncope. Admitted for acute hypoxic respiratory failure secondary to COVID infection.  PMH includes COPD, chronic pain and also on Suboxone. Pt recently d/c from hospital in [facility] with possible nonestrogen MI when patient underwent cardiac cath.   OT comments  Pt progressing well towards acute OT goals. Reports she recently finished a sponge bath and grooming tasks EOB. Focus of session was energy conservation education for managing ADLs at home and provided handout on COPD action plan for use at home. Son present throughout session. Pt seated EOB at start and end of session. D/c plan remains appropriate.    Follow Up Recommendations  Home health OT;Supervision - Intermittent    Equipment Recommendations  None recommended by OT    Recommendations for Other Services      Precautions / Restrictions Precautions Precautions: Other (comment) Precaution Comments: monitor O2 sats Restrictions Weight Bearing Restrictions: No       Mobility Bed Mobility               General bed mobility comments: sitting EOB at start and end of session    Transfers                      Balance Overall balance assessment: Mild deficits observed, not formally tested                                         ADL either performed or assessed with clinical judgement   ADL Overall ADL's : Needs assistance/impaired                                       General ADL Comments: Education provided on energy conservation strategies for ADL management at home. Also provided handout on COPD Action Plan. Son present throughout session and included in education. PT sitting EOB throughout session on 3L O2 with sat >94.     Vision       Perception      Praxis      Cognition Arousal/Alertness: Awake/alert Behavior During Therapy: WFL for tasks assessed/performed Overall Cognitive Status: Within Functional Limits for tasks assessed                                          Exercises     Shoulder Instructions       General Comments Son present throughout session.    Pertinent Vitals/ Pain       Pain Assessment: No/denies pain  Home Living                                          Prior Functioning/Environment              Frequency  Min 2X/week        Progress Toward Goals  OT Goals(current goals can now be found in the care plan section)  Progress towards OT goals: Progressing toward  goals  Acute Rehab OT Goals Patient Stated Goal: get better, go home OT Goal Formulation: With patient Time For Goal Achievement: 06/28/21 Potential to Achieve Goals: Good ADL Goals Pt Will Perform Grooming: with supervision;standing Pt Will Perform Lower Body Bathing: with supervision;sit to/from stand Pt Will Perform Lower Body Dressing: with supervision;sit to/from stand Pt Will Transfer to Toilet: with modified independence;ambulating Pt Will Perform Toileting - Clothing Manipulation and hygiene: Independently;sitting/lateral leans;sit to/from stand  Plan Discharge plan remains appropriate    Co-evaluation                 AM-PAC OT "6 Clicks" Daily Activity     Outcome Measure   Help from another person eating meals?: None Help from another person taking care of personal grooming?: None Help from another person toileting, which includes using toliet, bedpan, or urinal?: A Little Help from another person bathing (including washing, rinsing, drying)?: A Little Help from another person to put on and taking off regular upper body clothing?: None Help from another person to put on and taking off regular lower body clothing?: A Little 6 Click Score: 21    End of Session  Equipment Utilized During Treatment: Oxygen (3L)  OT Visit Diagnosis: Unsteadiness on feet (R26.81);Muscle weakness (generalized) (M62.81);Pain   Activity Tolerance Patient tolerated treatment well   Patient Left with call bell/phone within reach;with family/visitor present;Other (comment) (sitting EOB)   Nurse Communication          Time: 9233-0076 OT Time Calculation (min): 14 min  Charges: OT General Charges $OT Visit: 1 Visit OT Treatments $Self Care/Home Management : 8-22 mins  Kim Greene, OT Acute Rehabilitation Services Pager: [phone] Office: [phone]    Kim Greene 06/15/2021, 12:28 PM

## 2021-06-15 NOTE — TOC Initial Note (Signed)
Transition of Care [facility]) - Initial/Assessment Note    Patient Details  Name: Kim Greene MRN: [id number] Date of Birth: [birth year]  [facility] CM/SW Contact:    Lockie Pares, RN Phone Number: 06/15/2021, 3:00 PM  Clinical Narrative:                  Spoke to patient about home health. She has oxygen at home. Appointment made for patient to establish PCP oin [place]. She states that she is currently living in [place], and plans to move to [place] soon with her son. Due to her moving and all she is unsure if she should have home health versus outpatient. She will discuss with her son and this CM will contact her later this afternoon to discuss.  Expected Discharge Plan: Home w Home Health Services Barriers to Discharge:  (patient currently living in [place])   Patient Goals and CMS Choice        Expected Discharge Plan and Services Expected Discharge Plan: Home w Home Health Services   Discharge Planning Services: CM Consult                                          Prior Living Arrangements/Services   Lives with:: Adult Children Patient language and need for interpreter reviewed:: Yes        Need for Family Participation in Patient Care: Yes (Comment) Care giver support system in place?: Yes (comment)   Criminal Activity/Legal Involvement Pertinent to Current Situation/Hospitalization: No - Comment as needed  Activities of Daily Living Home Assistive Devices/Equipment: None ADL Screening (condition at time of admission) Patient's cognitive ability adequate to safely complete daily activities?: Yes Is the patient deaf or have difficulty hearing?: No Does the patient have difficulty seeing, even when wearing glasses/contacts?: No Does the patient have difficulty concentrating, remembering, or making decisions?: No Patient able to express need for assistance with ADLs?: No Does the patient have difficulty dressing or bathing?:  No Independently performs ADLs?: Yes (appropriate for developmental age) Does the patient have difficulty walking or climbing stairs?: No Weakness of Legs: None Weakness of Arms/Hands: None  Permission Sought/Granted                  Emotional Assessment   Attitude/Demeanor/Rapport: Gracious Affect (typically observed): Calm Orientation: : Oriented to Self, Oriented to Place, Oriented to  Time, Oriented to Situation Alcohol / Substance Use:  (on suboxone) Psych Involvement: No (comment)  Admission diagnosis:  Pain [R52] Hypoxia [R09.02] Acute hypoxemic respiratory failure due to COVID-19 (HCC) [U07.1, J96.01] COVID-19 virus infection [U07.1] Patient Active Problem List   Diagnosis Date Noted   Bruise 06/13/2021   Left leg swelling 06/13/2021   Acute on chronic respiratory failure with hypoxia (HCC) 06/12/2021   HLD (hyperlipidemia) 06/12/2021   COPD (chronic obstructive pulmonary disease) (HCC) 06/12/2021   Pneumonia due to COVID-19 virus 06/12/2021   Hypokalemia 06/12/2021   Macrocytosis without anemia 06/12/2021   CAD (coronary artery disease) 06/12/2021   PCP:  Patient, No Pcp Per (Inactive) Pharmacy:   [facility] [phone] - [place], [place] - [address] [place] [address] Phone: [phone] Fax: [phone]     Social Determinants of Health (SDOH) Interventions    Readmission Risk Interventions No flowsheet data found.

## 2021-06-15 NOTE — Progress Notes (Signed)
Progress Note    Kim SensorStacy Greene   ZOX:[id number]  DOB: Dec 25, 1960  DOA: 06/11/2021     3  PCP: Patient, No Pcp Per (Inactive)  CC: SOB, fever, cough  Hospital Course: Kim Greene is a 60 yo female with PMH COPD, chronic pain and also on Suboxone was recently admitted at the hospital [facility] [place] what looks like could be an NSTEMI when patient underwent cardiac cath and was told to take aspirin Plavix and statins which are new at the time patient also was placed on Z-Pak and steroids was discharged about 4 days PTA, [facility] daughter to move back to her [place].   Evening prior to admission, she went with her son for shopping at [employer] patient appeared confused and was having some strange smell and later on passed out on the floor.  She did regain consciousness soon but was brought to the ER.  Did not complain of any chest pain but did have shortness of breath.   After her discharge from the hospital 4 days ago she was found to have bruising on the left lower extremity and left lower quadrant of the abdomen.  Denies any abdominal pain.  Patient usually takes Vyvanse but hospital doctor at discharge advised her to stop taking Vyvanse due to her cardiac issues she had at the hospital.   ED Course: In the ER patient was confused initially requiring 100% nonrebreather presently on 5 L.  Chest x-ray shows bilateral infiltrates COVID test came back positive patient also was febrile with temperature of 101.7.  CT head is unremarkable.  EKG shows normal sinus rhythm with nonspecific ST-T changes.  Patient is started on Decadron remdesivir admitted for acute hypoxic respiratory failure secondary to COVID infection. She confirmed with her son present in the ER that she wished to be a DNR/DNI.  Interval History:   No significant issues overnight, still reports some cough, denies any fever or chills.  Assessment & Plan:   Pneumonia due  to COVID-19 virus - At risk for further decompensation with underlying newly diagnosed CAD.   -Continue remdesivir, to finish 5 days days day 4/5 -Continue with IV steroids due to hypoxia, Decadron has been changed to Solu-Medrol, will be able to change to prednisone tomorrow. - CODE STATUS confirmed in the ER, she elects for DNR/DNI, her son was bedside as witness for this conversation -Was encouraged use incentive spirometry, flutter valve, she was encouraged to get out of bed to chair .   Acute on chronic respiratory failure with hypoxia (HCC) - On 2 L chronically at home but has been noncompliant recently - Continues to smoke, 1-2 PPD/day -Continue O2 and wean as able -Acute worsening considered due to underlying COVID infection as well as probable COPD exacerbation   Left leg swelling - Scattered bruises on left lower extremity.  Unclear etiology - Doppler with no evidence of DVT, but likely related to ruptured Baker's cyst.  Versus hematoma - No pain, erythema   Bruise - large abdominal bruise noted on admission  - No acute abnormality on CT abdomen/pelvis; large hiatal hernia noted - Possibly from previous Lovenox injections while at previous hospital - Bruise remained stable in size and appearance   CAD (coronary artery disease) - [facility] where she was there recently for possible NSTEMI - Continue aspirin, Plavix, statin   Macrocytosis without anemia - Check iron studies (low, started on oral iron) - folate  8.5, started MVI - B12 = 86 (started on oral B12; no neuropathic complaints)   Hypokalemia -replaced, continue to monitor closely   COPD (chronic obstructive pulmonary disease) (HCC) - continue steroids and breathing treatments    HLD (hyperlipidemia) - continue lipitor      Antimicrobials: Remdesivir 7/26 >> current  DVT prophylaxis: enoxaparin (LOVENOX) injection 40 mg Start: 06/14/21 2200   Code Status:   Code Status:  DNR Family Communication: Kim Greene at bedside  Disposition Plan: Status is: Inpatient  Remains inpatient appropriate because:IV treatments appropriate due to intensity of illness or inability to take PO and Inpatient level of care appropriate due to severity of illness  Dispo: The patient is from: Home              Anticipated d/c is to: Home              Patient currently is not medically stable to d/c.   Difficult to place patient No  Risk of unplanned readmission score: Unplanned Admission- Pilot do not use: 16.34   Objective: Blood pressure 113/73, pulse (!) 54, temperature 97.9 F (36.6 C), temperature source Oral, resp. rate 18, height 5\' 2"  (1.575 m), weight 66.2 kg, SpO2 94 %.  Examination:  Awake Alert, Oriented X 3, No new F.N deficits, Normal affect Symmetrical Chest wall movement, Good air movement bilaterally, scattered Rales RRR,No Gallops,Rubs or new Murmurs, No Parasternal Heave +ve B.Sounds, Abd Soft, No tenderness, No rebound - guarding or rigidity. No Cyanosis, Clubbing or edema, No new Rash or bruise      Consultants:  None  Data Reviewed: I have personally reviewed following labs and imaging studies Results for orders placed or performed during the hospital encounter of 06/11/21 (from the past 24 hour(s))  CBC with Differential/Platelet     Status: Abnormal   Collection Time: 06/15/21  1:35 AM  Result Value Ref Range   WBC 8.6 4.0 - 10.5 K/uL   RBC 3.47 (L) 3.87 - 5.11 MIL/uL   Hemoglobin 11.4 (L) 12.0 - 15.0 g/dL   HCT 06/17/21 (L) 67.8 - 93.8 %   MCV 103.5 (H) 80.0 - 100.0 fL   MCH 32.9 26.0 - 34.0 pg   MCHC 31.8 30.0 - 36.0 g/dL   RDW 10.1 75.1 - 02.5 %   Platelets 157 150 - 400 K/uL   nRBC 0.0 0.0 - 0.2 %   Neutrophils Relative % 90 %   Neutro Abs 7.8 (H) 1.7 - 7.7 K/uL   Lymphocytes Relative 6 %   Lymphs Abs 0.5 (L) 0.7 - 4.0 K/uL   Monocytes Relative 3 %   Monocytes Absolute 0.2 0.1 - 1.0 K/uL   Eosinophils Relative 0 %   Eosinophils Absolute  0.0 0.0 - 0.5 K/uL   Basophils Relative 0 %   Basophils Absolute 0.0 0.0 - 0.1 K/uL   Immature Granulocytes 1 %   Abs Immature Granulocytes 0.06 0.00 - 0.07 K/uL  Comprehensive metabolic panel     Status: Abnormal   Collection Time: 06/15/21  1:35 AM  Result Value Ref Range   Sodium 139 135 - 145 mmol/L   Potassium 4.4 3.5 - 5.1 mmol/L   Chloride 101 98 - 111 mmol/L   CO2 34 (H) 22 - 32 mmol/L   Glucose, Bld 138 (H) 70 - 99 mg/dL   BUN 15 6 - 20 mg/dL   Creatinine, Ser 06/17/21 0.44 - 1.00 mg/dL   Calcium 7.7 (L) 8.9 - 10.3 mg/dL   Total Protein  5.4 (L) 6.5 - 8.1 g/dL   Albumin 2.4 (L) 3.5 - 5.0 g/dL   AST 15 15 - 41 U/L   ALT 14 0 - 44 U/L   Alkaline Phosphatase 61 38 - 126 U/L   Total Bilirubin 0.5 0.3 - 1.2 mg/dL   GFR, Estimated >16 >10 mL/min   Anion gap 4 (L) 5 - 15  C-reactive protein     Status: Abnormal   Collection Time: 06/15/21  1:35 AM  Result Value Ref Range   CRP 6.4 (H) <1.0 mg/dL  D-dimer, quantitative     Status: Abnormal   Collection Time: 06/15/21  1:35 AM  Result Value Ref Range   D-Dimer, Quant 1.18 (H) 0.00 - 0.50 ug/mL-FEU  Magnesium     Status: None   Collection Time: 06/15/21  1:35 AM  Result Value Ref Range   Magnesium 1.9 1.7 - 2.4 mg/dL    Recent Results (from the past 240 hour(s))  Resp Panel by RT-PCR (Flu A&B, Covid) Nasopharyngeal Swab     Status: Abnormal   Collection Time: 06/12/21  2:21 AM   Specimen: Nasopharyngeal Swab; Nasopharyngeal(NP) swabs in vial transport medium  Result Value Ref Range Status   SARS Coronavirus 2 by RT PCR POSITIVE (A) NEGATIVE Final    Comment: RESULT CALLED TO, READ BACK BY AND VERIFIED WITH: RN BOBBY SAGALANG  BY MESSAN H. AT 0348 ON 7 26 2022 (NOTE) SARS-CoV-2 target nucleic acids are DETECTED.  The SARS-CoV-2 RNA is generally detectable in upper respiratory specimens during the acute phase of infection. Positive results are indicative of the presence of the identified virus, but do not rule out bacterial  infection or co-infection with other pathogens not detected by the test. Clinical correlation with patient history and other diagnostic information is necessary to determine patient infection status. The expected result is Negative.  Fact Sheet for Patients: BloggerCourse.com  Fact Sheet for Healthcare Providers: SeriousBroker.it  This test is not yet approved or cleared by the [place] FDA and  has been authorized for detection and/or diagnosis of SARS-CoV-2 by FDA under an Emergency Use Authorization (EUA).  This EUA will remain in effect (meani ng this test can be used) for the duration of  the COVID-19 declaration under Section 564(b)(1) of the Act, 21 U.S.C. section 360bbb-3(b)(1), unless the authorization is terminated or revoked sooner.     Influenza A by PCR NEGATIVE NEGATIVE Final   Influenza B by PCR NEGATIVE NEGATIVE Final    Comment: (NOTE) The Xpert Xpress SARS-CoV-2/FLU/RSV plus assay is intended as an aid in the diagnosis of influenza from Nasopharyngeal swab specimens and should not be used as a sole basis for treatment. Nasal washings and aspirates are unacceptable for Xpert Xpress SARS-CoV-2/FLU/RSV testing.  Fact Sheet for Patients: BloggerCourse.com  Fact Sheet for Healthcare Providers: SeriousBroker.it  This test is not yet approved or cleared by the [place] FDA and has been authorized for detection and/or diagnosis of SARS-CoV-2 by FDA under an Emergency Use Authorization (EUA). This EUA will remain in effect (meaning this test can be used) for the duration of the COVID-19 declaration under Section 564(b)(1) of the Act, 21 U.S.C. section 360bbb-3(b)(1), unless the authorization is terminated or revoked.  Performed at [facility], [address], [place], [place] [address]      Radiology Studies: VAS [place] LOWER EXTREMITY VENOUS  (DVT)  Result Date: 06/14/2021  Lower Venous DVT Study Patient Name:  HUSNA KRONE  Date of Exam:  06/13/2021 Medical Rec #: [id number]      Accession #:    [id number] Date of Birth: 12-Dec-1960      Patient Gender: F Patient Age:   60Y Exam Location:  [facility] Procedure:      VAS [place] LOWER EXTREMITY VENOUS (DVT) Referring Phys: [id number] DAVID GIRGUIS --------------------------------------------------------------------------------  Indications: Swelling and brusing to LLE.  Comparison Study: No previous exams Performing Technologist: Jody Hill RVT, RDMS  Examination Guidelines: A complete evaluation includes B-mode imaging, spectral Doppler, color Doppler, and power Doppler as needed of all accessible portions of each vessel. Bilateral testing is considered an integral part of a complete examination. Limited examinations for reoccurring indications may be performed as noted. The reflux portion of the exam is performed with the patient in reverse Trendelenburg.  +-----+---------------+---------+-----------+----------+--------------+ RIGHTCompressibilityPhasicitySpontaneityPropertiesThrombus Aging +-----+---------------+---------+-----------+----------+--------------+ CFV  Full           Yes      Yes                                 +-----+---------------+---------+-----------+----------+--------------+   +---------+---------------+---------+-----------+----------+--------------+ LEFT     CompressibilityPhasicitySpontaneityPropertiesThrombus Aging +---------+---------------+---------+-----------+----------+--------------+ CFV      Full           Yes      Yes                                 +---------+---------------+---------+-----------+----------+--------------+ SFJ      Full                                                        +---------+---------------+---------+-----------+----------+--------------+ FV Prox  Full           Yes      Yes                                  +---------+---------------+---------+-----------+----------+--------------+ FV Mid   Full           Yes      Yes                                 +---------+---------------+---------+-----------+----------+--------------+ FV DistalFull           Yes      Yes                                 +---------+---------------+---------+-----------+----------+--------------+ PFV      Full                                                        +---------+---------------+---------+-----------+----------+--------------+ POP      Full           Yes      Yes                                 +---------+---------------+---------+-----------+----------+--------------+  PTV      Full                                                        +---------+---------------+---------+-----------+----------+--------------+ PERO     Full                                                        +---------+---------------+---------+-----------+----------+--------------+     Summary: RIGHT: - No evidence of common femoral vein obstruction.  LEFT: - There is no evidence of deep vein thrombosis in the lower extremity. - There is no evidence of superficial venous thrombosis.  - Ultrasound characteristics of a ruptured Baker's Cyst vs hematoma are noted.  *See table(s) above for measurements and observations. Electronically signed by Lemar Livings MD on 06/14/2021 at 2:49:32 PM.    Final    VAS [place] LOWER EXTREMITY VENOUS (DVT)  Final Result    CT ABDOMEN PELVIS WO CONTRAST  Final Result    DG FEMUR MIN 2 VIEWS LEFT  Final Result    DG Tibia/Fibula Left  Final Result    CT HEAD WO CONTRAST  Final Result    DG Chest 2 View  Final Result      Scheduled Meds:  vitamin C  500 mg Oral Daily   aspirin EC  81 mg Oral Daily   atorvastatin  40 mg Oral QHS   buprenorphine-naloxone  1 tablet Sublingual BID   clopidogrel  75 mg Oral Daily   dextromethorphan-guaiFENesin  1 tablet Oral BID   enoxaparin  (LOVENOX) injection  40 mg Subcutaneous QHS   ferrous sulfate  325 mg Oral Q breakfast   fluticasone furoate-vilanterol  1 puff Inhalation Daily   methylPREDNISolone (SOLU-MEDROL) injection  60 mg Intravenous BID   multivitamin with minerals  1 tablet Oral Daily   nicotine  21 mg Transdermal Daily   pregabalin  200 mg Oral TID   umeclidinium bromide  1 puff Inhalation Daily   vitamin B-12  1,000 mcg Oral Daily   zinc sulfate  220 mg Oral Daily   PRN Meds: acetaminophen **OR** acetaminophen Continuous Infusions:  remdesivir 100 mg in NS 100 mL 100 mg (06/15/21 1031)     LOS: 3 days    Huey Bienenstock, MD [facility] 06/15/2021, 2:38 PM

## 2021-06-16 DIAGNOSIS — U071 COVID-19: Secondary | ICD-10-CM | POA: Diagnosis not present

## 2021-06-16 DIAGNOSIS — I2510 Atherosclerotic heart disease of native coronary artery without angina pectoris: Secondary | ICD-10-CM | POA: Diagnosis not present

## 2021-06-16 DIAGNOSIS — J449 Chronic obstructive pulmonary disease, unspecified: Secondary | ICD-10-CM | POA: Diagnosis not present

## 2021-06-16 DIAGNOSIS — J9621 Acute and chronic respiratory failure with hypoxia: Secondary | ICD-10-CM | POA: Diagnosis not present

## 2021-06-16 MED ORDER — SYRINGE 2-3 ML 3 ML MISC: 0 refills | Status: AC

## 2021-06-16 MED ORDER — CYANOCOBALAMIN 1000 MCG/ML IJ SOLN: INTRAMUSCULAR | 0 refills | Status: AC

## 2021-06-16 MED ORDER — PREDNISONE 10 MG PO TABS
ORAL_TABLET | ORAL | 0 refills | Status: AC
Start: 2021-06-16 — End: ?

## 2021-06-16 MED ORDER — NICOTINE 21 MG/24HR TD PT24: 21.0000 mg | MEDICATED_PATCH | Freq: Every day | TRANSDERMAL | 0 refills | Status: AC

## 2021-06-16 MED ORDER — CYANOCOBALAMIN 1000 MCG/ML IJ SOLN
1000.0000 ug | Freq: Once | INTRAMUSCULAR | Status: AC
  Administered 2021-06-16: 1000 ug via INTRAMUSCULAR
  Filled 2021-06-16: qty 1

## 2021-06-16 MED ORDER — CYANOCOBALAMIN 1000 MCG PO TABS
1000.0000 ug | ORAL_TABLET | Freq: Every day | ORAL | 3 refills | Status: DC
Start: 2021-06-17 — End: 2021-06-16

## 2021-06-16 MED ORDER — ACETAMINOPHEN 325 MG PO TABS: 650.0000 mg | ORAL_TABLET | Freq: Four times a day (QID) | ORAL | Status: AC | PRN

## 2021-06-16 MED ORDER — "NEEDLE (DISP) 14G X 1"" MISC": 0 refills | Status: AC

## 2021-06-16 NOTE — Discharge Summary (Signed)
Physician Discharge Summary  Kim Greene LYY:[id number] DOB: 10-May-1961 DOA: 06/11/2021  PCP: Patient, No Pcp Per (Inactive)  Admit date: 06/11/2021 Discharge date: 06/16/2021  Admitted From:Home Disposition:  Home  Recommendations for Outpatient Follow-up:  Follow up with PCP in 1-2 weeks Please obtain BMP/CBC in one week Please continue counseling about tobacco abuse.  Home Health:NO Equipment/Devices:she has home oxygen  Discharge Condition:Stable CODE STATUS:FULL Diet recommendation: Heart Healthy   Brief/Interim Summary:   Pneumonia due to COVID-19 virus -Patient at risk for decompensation for under lying COPD, CAD, so she was admitted for further management, she was treated with remdesivir, finished total 5 days, she was treated with IV steroids as well headache as well, symptoms has improved, she was transitioned to oral steroids, her oxygen requirement back to baseline on 2 L nasal cannula, she has oxygen supply at home.  As well COPD was contributing to her hypoxia as well.     Acute on chronic respiratory failure with hypoxia (HCC) - On 2 L chronically at home but has been noncompliant recently - Continues to smoke, 1-2 PPD/day -Continue O2 and wean as able -Acute worsening considered due to underlying COVID infection as well as probable COPD exacerbation   Left leg swelling - Scattered bruises on left lower extremity.  Unclear etiology - Doppler with no evidence of DVT, but likely related to ruptured Baker's cyst.  Versus hematoma - No pain, erythema, improving   Bruise - large abdominal bruise noted on admission  - No acute abnormality on CT abdomen/pelvis; large hiatal hernia noted - Possibly from previous Lovenox injections while at previous hospital - Bruise remained stable in size and appearance   CAD (coronary artery disease) - Records requested from [facility] where she was there recently for possible NSTEMI - Continue aspirin, Plavix, statin    Macrocytosis without anemia/B12 deficiency - folate 8.5 - B12 = 86, started on IV supplements.   Hypokalemia -replaced, continue to monitor closely   COPD (chronic obstructive pulmonary disease) (HCC) -Continue with steroid taper, and breathing treatments, continue with home medications.   HLD (hyperlipidemia) - continue lipitor     Discharge Diagnoses:  Principal Problem:   Pneumonia due to COVID-19 virus Active Problems:   Acute on chronic respiratory failure with hypoxia (HCC)   HLD (hyperlipidemia)   COPD (chronic obstructive pulmonary disease) (HCC)   Hypokalemia   Macrocytosis without anemia   CAD (coronary artery disease)   Bruise   Left leg swelling    Discharge Instructions  Discharge Instructions     Diet - low sodium heart healthy   Complete by: As directed    Discharge instructions   Complete by: As directed    Follow with Primary MD  in 7 days   Get CBC, CMP,  checked  by Primary MD next visit.    Activity: As tolerated with Full fall precautions use walker/cane & assistance as needed   Disposition Home    Diet: Heart Healthy , with feeding assistance and aspiration precautions.  On your next visit with your primary care physician please Get Medicines reviewed and adjusted.   Please request your Prim.MD to go over all Hospital Tests and Procedure/Radiological results at the follow up, please get all Hospital records sent to your Prim MD by signing hospital release before you go home.   If you experience worsening of your admission symptoms, develop shortness of breath, life threatening emergency, suicidal or homicidal thoughts you must seek medical attention immediately by calling 911 or calling  your MD immediately  if symptoms less severe.  You Must read complete instructions/literature along with all the possible adverse reactions/side effects for all the Medicines you take and that have been prescribed to you. Take any new Medicines after  you have completely understood and accpet all the possible adverse reactions/side effects.   Do not drive, operating heavy machinery, perform activities at heights, swimming or participation in water activities or provide baby sitting services if your were admitted for syncope or siezures until you have seen by Primary MD or a Neurologist and advised to do so again.  Do not drive when taking Pain medications.    Do not take more than prescribed Pain, Sleep and Anxiety Medications  Special Instructions: If you have smoked or chewed Tobacco  in the last 2 yrs please stop smoking, stop any regular Alcohol  and or any Recreational drug use.  Wear Seat belts while driving.   Please note  You were cared for by a hospitalist during your hospital stay. If you have any questions about your discharge medications or the care you received while you were in the hospital after you are discharged, you can call the unit and asked to speak with the hospitalist on call if the hospitalist that took care of you is not available. Once you are discharged, your primary care physician will handle any further medical issues. Please note that NO REFILLS for any discharge medications will be authorized once you are discharged, as it is imperative that you return to your primary care physician (or establish a relationship with a primary care physician if you do not have one) for your aftercare needs so that they can reassess your need for medications and monitor your lab values.   Increase activity slowly   Complete by: As directed       Allergies as of 06/16/2021   No Known Allergies      Medication List     STOP taking these medications    methylPREDNISolone 4 MG Tbpk tablet Commonly known as: MEDROL DOSEPAK       TAKE these medications    acetaminophen 325 MG tablet Commonly known as: TYLENOL Take 2 tablets (650 mg total) by mouth every 6 (six) hours as needed for mild pain (or Fever >/= 101). What  changed:  medication strength how much to take reasons to take this   albuterol (2.5 MG/3ML) 0.083% nebulizer solution Commonly known as: PROVENTIL Take 2.5 mg by nebulization every 6 (six) hours as needed for wheezing or shortness of breath.   alendronate 70 MG tablet Commonly known as: FOSAMAX Take 70 mg by mouth once a week.   aspirin EC 81 MG tablet Take 81 mg by mouth daily. Swallow whole.   atorvastatin 40 MG tablet Commonly known as: LIPITOR Take 40 mg by mouth daily.   buprenorphine-naloxone 8-2 mg Subl SL tablet Commonly known as: SUBOXONE Place 1 tablet under the tongue 2 (two) times daily.   clopidogrel 75 MG tablet Commonly known as: PLAVIX Take 75 mg by mouth daily.   hydroxypropyl methylcellulose / hypromellose 2.5 % ophthalmic solution Commonly known as: ISOPTO TEARS / GONIOVISC Place 1 drop into both eyes daily as needed for dry eyes.   lansoprazole 30 MG capsule Commonly known as: PREVACID Take 30 mg by mouth daily at 12 noon.   nicotine 21 mg/24hr patch Commonly known as: NICODERM CQ - dosed in mg/24 hours Place 1 patch (21 mg total) onto the skin daily.   predniSONE  10 MG tablet Commonly known as: DELTASONE Take 30 mg oral daily x2 days, then 20 mg oral daily x2 days, then 10 mg oral daily x2 days, then discontinue.   pregabalin 200 MG capsule Commonly known as: LYRICA Take 200 mg by mouth 3 (three) times daily.   Trelegy Ellipta 100-62.5-25 MCG/INH Aepb Generic drug: Fluticasone-Umeclidin-Vilant Take 1 puff by mouth daily.   Vitamin D (Ergocalciferol) 1.25 MG (50000 UNIT) Caps capsule Commonly known as: DRISDOL Take 50,000 Units by mouth once a week.   Vyvanse 30 MG Chew Generic drug: Lisdexamfetamine Dimesylate Chew 30 mg by mouth daily.        Follow-up Information     Medicine, [facility]. Schedule an appointment as soon as possible for a visit on 06/27/2021.   Specialty: Internal Medicine Why: 1:45 pm Contact  information: [address] [place] [address] [phone]                No Known Allergies  Consultations: NOne   Procedures/Studies: CT ABDOMEN PELVIS WO CONTRAST  Result Date: 06/12/2021 CLINICAL DATA:  Diffuse abdominal pain, distention, altered mental status EXAM: CT ABDOMEN AND PELVIS WITHOUT CONTRAST TECHNIQUE: Multidetector CT imaging of the abdomen and pelvis was performed following the standard protocol without IV contrast. COMPARISON:  None. FINDINGS: Lower chest: Large hiatal hernia. Heart is normal size. Patchy ground-glass opacities in the lingula and both lower lobes as well as right middle lobe could reflect areas of pneumonia. No effusions. Hepatobiliary: No focal liver abnormality is seen. Status post cholecystectomy. No biliary dilatation. Pancreas: No focal abnormality or ductal dilatation. Spleen: No focal abnormality.  Normal size. Adrenals/Urinary Tract: No adrenal abnormality. No focal renal abnormality. No stones or hydronephrosis. Urinary bladder is unremarkable. Stomach/Bowel: Stomach, large and small bowel grossly unremarkable. Vascular/Lymphatic: Aortic atherosclerosis. No evidence of aneurysm or adenopathy. Reproductive: Prior hysterectomy.  No adnexal masses. Other: No free fluid or free air. Musculoskeletal: No acute bony abnormality. IMPRESSION: Large hiatal hernia. Patchy ground-glass airspace opacities in the lung bases could reflect early multifocal pneumonia. Aortic atherosclerosis. No acute findings in the abdomen or pelvis. Electronically Signed   By: Charlett Nose M.D.   On: 06/12/2021 10:15   DG Chest 2 View  Result Date: 06/12/2021 CLINICAL DATA:  Cough, altered mental status EXAM: CHEST - 2 VIEW COMPARISON:  None. FINDINGS: Diffuse interstitial opacity throughout the lungs with some mild airways thickening. No pneumothorax or effusion. The aorta is calcified. The remaining cardiomediastinal contours are unremarkable. Degenerative changes are  present in the imaged spine and shoulders. No acute osseous or soft tissue abnormality. IMPRESSION: Diffuse interstitial opacities, could reflect acute or chronic interstitial changes and/or developing edema. Aortic Atherosclerosis (ICD10-I70.0). Electronically Signed   By: Kreg Shropshire M.D.   On: 06/12/2021 01:08   DG Tibia/Fibula Left  Result Date: 06/12/2021 CLINICAL DATA:  Left lower extremity swelling. EXAM: LEFT TIBIA AND FIBULA - 2 VIEW COMPARISON:  None. FINDINGS: The knee and ankle joints are maintained. The tibia and fibula are intact. IMPRESSION: No acute bony findings. Electronically Signed   By: Rudie Meyer M.D.   On: 06/12/2021 07:08   CT HEAD WO CONTRAST  Result Date: 06/12/2021 CLINICAL DATA:  60 year old female with altered mental status. EXAM: CT HEAD WITHOUT CONTRAST TECHNIQUE: Contiguous axial images were obtained from the base of the skull through the vertex without intravenous contrast. COMPARISON:  None. FINDINGS: Study is mildly degraded by motion artifact despite repeated imaging attempts. Brain: Cerebral volume is within normal limits  for age. No midline shift, ventriculomegaly, mass effect, evidence of mass lesion, intracranial hemorrhage or evidence of cortically based acute infarction. Largely normal for age gray-white matter differentiation, minimal to mild scattered white matter hypodensity. No cortical encephalomalacia identified. Vascular: Calcified atherosclerosis at the skull base. No suspicious intracranial vascular hyperdensity. Skull: No acute osseous abnormality identified. Sinuses/Orbits: Mild bilateral paranasal sinus mucosal thickening, relatively sparing the frontal sinuses. No sinus fluid level identified. Other: Bubbly opacity throughout the bilateral nasal cavity and nasopharynx. Tympanic cavities and mastoids are clear. Visualized orbit soft tissues are within normal limits. Visualized scalp soft tissues are within normal limits. IMPRESSION: 1. Mild motion  artifact. Normal for age non contrast CT appearance of the brain. 2. Retained secretions in the nasopharynx and mild bilateral paranasal sinus inflammation. Electronically Signed   By: Odessa Fleming M.D.   On: 06/12/2021 04:14   DG FEMUR MIN 2 VIEWS LEFT  Result Date: 06/12/2021 CLINICAL DATA:  Pain and bruising. History of cardiac catheterization 2 weeks ago. EXAM: LEFT FEMUR 2 VIEWS COMPARISON:  None. FINDINGS: The left hip is normally located. No fracture or AVN. The visualized left hemipelvic bony structures are intact. The left femur is intact. IMPRESSION: No acute bony findings. Electronically Signed   By: Rudie Meyer M.D.   On: 06/12/2021 07:07   VAS [place] LOWER EXTREMITY VENOUS (DVT)  Result Date: 06/14/2021  Lower Venous DVT Study Patient Name:  MAVERY MILLING  Date of Exam:   06/13/2021 Medical Rec #: [id number]      Accession #:    [id number] Date of Birth: 11/15/1961      Patient Gender: F Patient Age:   60Y Exam Location:  [facility] Procedure:      VAS [place] LOWER EXTREMITY VENOUS (DVT) Referring Phys: [id number] DAVID GIRGUIS --------------------------------------------------------------------------------  Indications: Swelling and brusing to LLE.  Comparison Study: No previous exams Performing Technologist: Jody Hill RVT, RDMS  Examination Guidelines: A complete evaluation includes B-mode imaging, spectral Doppler, color Doppler, and power Doppler as needed of all accessible portions of each vessel. Bilateral testing is considered an integral part of a complete examination. Limited examinations for reoccurring indications may be performed as noted. The reflux portion of the exam is performed with the patient in reverse Trendelenburg.  +-----+---------------+---------+-----------+----------+--------------+ RIGHTCompressibilityPhasicitySpontaneityPropertiesThrombus Aging +-----+---------------+---------+-----------+----------+--------------+ CFV  Full           Yes      Yes                                  +-----+---------------+---------+-----------+----------+--------------+   +---------+---------------+---------+-----------+----------+--------------+ LEFT     CompressibilityPhasicitySpontaneityPropertiesThrombus Aging +---------+---------------+---------+-----------+----------+--------------+ CFV      Full           Yes      Yes                                 +---------+---------------+---------+-----------+----------+--------------+ SFJ      Full                                                        +---------+---------------+---------+-----------+----------+--------------+ FV Prox  Full           Yes      Yes                                 +---------+---------------+---------+-----------+----------+--------------+  FV Mid   Full           Yes      Yes                                 +---------+---------------+---------+-----------+----------+--------------+ FV DistalFull           Yes      Yes                                 +---------+---------------+---------+-----------+----------+--------------+ PFV      Full                                                        +---------+---------------+---------+-----------+----------+--------------+ POP      Full           Yes      Yes                                 +---------+---------------+---------+-----------+----------+--------------+ PTV      Full                                                        +---------+---------------+---------+-----------+----------+--------------+ PERO     Full                                                        +---------+---------------+---------+-----------+----------+--------------+     Summary: RIGHT: - No evidence of common femoral vein obstruction.  LEFT: - There is no evidence of deep vein thrombosis in the lower extremity. - There is no evidence of superficial venous thrombosis.  - Ultrasound characteristics of a ruptured Baker's Cyst vs  hematoma are noted.  *See table(s) above for measurements and observations. Electronically signed by Lemar LivingsBrandon Cain MD on 06/14/2021 at 2:49:32 PM.    Final       Subjective: Patient reports she is feeling better, excited about going home, report dyspnea and cough has improved  Discharge Exam: Vitals:   06/16/21 0759 06/16/21 1138  BP: 129/81 113/68  Pulse: 68 93  Resp: 18 18  Temp: 98.1 F (36.7 C) 98.7 F (37.1 C)  SpO2: 93% 99%   Vitals:   06/16/21 0041 06/16/21 0433 06/16/21 0759 06/16/21 1138  BP: 108/62 108/61 129/81 113/68  Pulse: 60 (!) 56 68 93  Resp: 15 16 18 18   Temp: 98.3 F (36.8 C) 98.3 F (36.8 C) 98.1 F (36.7 C) 98.7 F (37.1 C)  TempSrc: Oral  Oral Oral  SpO2: 97% 97% 93% 99%  Weight:  68.2 kg    Height:        General: Pt is alert, awake, not in acute distress Cardiovascular: RRR, S1/S2 +, no rubs, no gallops Respiratory: CTA bilaterally, no wheezing, no rhonchi Abdominal: Soft, NT, ND, bowel sounds + Extremities: no edema, no cyanosis,  left lower extremity bruising significantly improved, no edema    The results of significant diagnostics from this hospitalization (including imaging, microbiology, ancillary and laboratory) are listed below for reference.     Microbiology: Recent Results (from the past 240 hour(s))  Resp Panel by RT-PCR (Flu A&B, Covid) Nasopharyngeal Swab     Status: Abnormal   Collection Time: 06/12/21  2:21 AM   Specimen: Nasopharyngeal Swab; Nasopharyngeal(NP) swabs in vial transport medium  Result Value Ref Range Status   SARS Coronavirus 2 by RT PCR POSITIVE (A) NEGATIVE Final    Comment: RESULT CALLED TO, READ BACK BY AND VERIFIED WITH: RN BOBBY SAGALANG  BY MESSAN H. AT 0348 ON 7 26 2022 (NOTE) SARS-CoV-2 target nucleic acids are DETECTED.  The SARS-CoV-2 RNA is generally detectable in upper respiratory specimens during the acute phase of infection. Positive results are indicative of the presence of the identified  virus, but do not rule out bacterial infection or co-infection with other pathogens not detected by the test. Clinical correlation with patient history and other diagnostic information is necessary to determine patient infection status. The expected result is Negative.  Fact Sheet for Patients: BloggerCourse.com  Fact Sheet for Healthcare Providers: SeriousBroker.it  This test is not yet approved or cleared by the [place] FDA and  has been authorized for detection and/or diagnosis of SARS-CoV-2 by FDA under an Emergency Use Authorization (EUA).  This EUA will remain in effect (meani ng this test can be used) for the duration of  the COVID-19 declaration under Section 564(b)(1) of the Act, 21 U.S.C. section 360bbb-3(b)(1), unless the authorization is terminated or revoked sooner.     Influenza A by PCR NEGATIVE NEGATIVE Final   Influenza B by PCR NEGATIVE NEGATIVE Final    Comment: (NOTE) The Xpert Xpress SARS-CoV-2/FLU/RSV plus assay is intended as an aid in the diagnosis of influenza from Nasopharyngeal swab specimens and should not be used as a sole basis for treatment. Nasal washings and aspirates are unacceptable for Xpert Xpress SARS-CoV-2/FLU/RSV testing.  Fact Sheet for Patients: BloggerCourse.com  Fact Sheet for Healthcare Providers: SeriousBroker.it  This test is not yet approved or cleared by the [place] FDA and has been authorized for detection and/or diagnosis of SARS-CoV-2 by FDA under an Emergency Use Authorization (EUA). This EUA will remain in effect (meaning this test can be used) for the duration of the COVID-19 declaration under Section 564(b)(1) of the Act, 21 U.S.C. section 360bbb-3(b)(1), unless the authorization is terminated or revoked.  Performed at [facility], [address], [place], [place] [address]      Labs: BNP (last 3  results) Recent Labs    06/12/21 0040  BNP 93.9   Basic Metabolic Panel: Recent Labs  Lab 06/12/21 0003 06/12/21 0209 06/12/21 0810 06/13/21 0111 06/14/21 0102 06/15/21 0135  NA 137 136  --  141 135 139  K 3.5 3.4*  --  3.8 3.9 4.4  CL 98  --   --  100 98 101  CO2 29  --   --  30 31 34*  GLUCOSE 90  --   --  158* 180* 138*  BUN 8  --   --  16 14 15   CREATININE 0.82  --  0.71 0.79 0.73 0.65  CALCIUM 7.2*  --   --  6.9* 7.3* 7.7*  MG  --   --   --  1.4* 2.0 1.9   Liver Function Tests: Recent Labs  Lab 06/12/21 0003  06/13/21 0111 06/14/21 0102 06/15/21 0135  AST ALT ALKPHOS 73 59 61 61  BILITOT 1.0 0.5 0.6 0.5  PROT 6.4* 5.8* 5.5* 5.4*  ALBUMIN 3.1* 2.6* 2.5* 2.4*   No results for input(s): LIPASE, AMYLASE in the last 168 hours. Recent Labs  Lab 06/12/21 0003  AMMONIA 33   CBC: Recent Labs  Lab 06/12/21 0003 06/12/21 0209 06/12/21 0810 06/13/21 0111 06/14/21 0102 06/15/21 0135  WBC 8.0  --  8.7 9.0 11.5* 8.6  NEUTROABS 6.0  --   --  8.2* 10.4* 7.8*  HGB 14.0 12.9 12.8 12.7 12.1 11.4*  HCT 43.0 38.0 38.5 39.4 37.5 35.9*  MCV 103.1*  --  102.1* 101.8* 103.3* 103.5*  PLT 126*  --  113* 157 142* 157   Cardiac Enzymes: No results for input(s): CKTOTAL, CKMB, CKMBINDEX, TROPONINI in the last 168 hours. BNP: Invalid input(s): POCBNP CBG: Recent Labs  Lab 06/11/21 2347  GLUCAP 90   D-Dimer Recent Labs    06/14/21 0102 06/15/21 0135  DDIMER 1.48* 1.18*   Hgb A1c No results for input(s): HGBA1C in the last 72 hours. Lipid Profile No results for input(s): CHOL, HDL, LDLCALC, TRIG, CHOLHDL, LDLDIRECT in the last 72 hours. Thyroid function studies No results for input(s): TSH, T4TOTAL, T3FREE, THYROIDAB in the last 72 hours.  Invalid input(s): FREET3 Anemia work up No results for input(s): VITAMINB12, FOLATE, FERRITIN, TIBC, IRON, RETICCTPCT in the last 72 hours. Urinalysis    Component Value Date/Time   COLORURINE  YELLOW 06/12/2021 0340   APPEARANCEUR HAZY (A) 06/12/2021 0340   LABSPEC 1.018 06/12/2021 0340   PHURINE 7.0 06/12/2021 0340   GLUCOSEU NEGATIVE 06/12/2021 0340   HGBUR NEGATIVE 06/12/2021 0340   BILIRUBINUR NEGATIVE 06/12/2021 0340   KETONESUR NEGATIVE 06/12/2021 0340   PROTEINUR 100 (A) 06/12/2021 0340   NITRITE NEGATIVE 06/12/2021 0340   LEUKOCYTESUR NEGATIVE 06/12/2021 0340   Sepsis Labs Invalid input(s): PROCALCITONIN,  WBC,  LACTICIDVEN Microbiology Recent Results (from the past 240 hour(s))  Resp Panel by RT-PCR (Flu A&B, Covid) Nasopharyngeal Swab     Status: Abnormal   Collection Time: 06/12/21  2:21 AM   Specimen: Nasopharyngeal Swab; Nasopharyngeal(NP) swabs in vial transport medium  Result Value Ref Range Status   SARS Coronavirus 2 by RT PCR POSITIVE (A) NEGATIVE Final    Comment: RESULT CALLED TO, READ BACK BY AND VERIFIED WITH: RN BOBBY SAGALANG  BY MESSAN H. AT 0348 ON 7 26 2022 (NOTE) SARS-CoV-2 target nucleic acids are DETECTED.  The SARS-CoV-2 RNA is generally detectable in upper respiratory specimens during the acute phase of infection. Positive results are indicative of the presence of the identified virus, but do not rule out bacterial infection or co-infection with other pathogens not detected by the test. Clinical correlation with patient history and other diagnostic information is necessary to determine patient infection status. The expected result is Negative.  Fact Sheet for Patients: BloggerCourse.com  Fact Sheet for Healthcare Providers: SeriousBroker.it  This test is not yet approved or cleared by the [place] FDA and  has been authorized for detection and/or diagnosis of SARS-CoV-2 by FDA under an Emergency Use Authorization (EUA).  This EUA will remain in effect (meani ng this test can be used) for the duration of  the COVID-19 declaration under Section 564(b)(1) of the Act, 21 U.S.C.  section 360bbb-3(b)(1), unless the authorization is terminated or revoked sooner.     Influenza A by PCR NEGATIVE  NEGATIVE Final   Influenza B by PCR NEGATIVE NEGATIVE Final    Comment: (NOTE) The Xpert Xpress SARS-CoV-2/FLU/RSV plus assay is intended as an aid in the diagnosis of influenza from Nasopharyngeal swab specimens and should not be used as a sole basis for treatment. Nasal washings and aspirates are unacceptable for Xpert Xpress SARS-CoV-2/FLU/RSV testing.  Fact Sheet for Patients: BloggerCourse.com  Fact Sheet for Healthcare Providers: SeriousBroker.it  This test is not yet approved or cleared by the [place] FDA and has been authorized for detection and/or diagnosis of SARS-CoV-2 by FDA under an Emergency Use Authorization (EUA). This EUA will remain in effect (meaning this test can be used) for the duration of the COVID-19 declaration under Section 564(b)(1) of the Act, 21 U.S.C. section 360bbb-3(b)(1), unless the authorization is terminated or revoked.  Performed at [facility], [address], [place], [place] [address]      Time coordinating discharge: Over 30 minutes  SIGNED:   Huey Bienenstock, MD  [facility] 06/16/2021, 1:42 PM Pager   If 7PM-7AM, please contact night-coverage [email or web address] Password [email or web address]

## 2021-06-16 NOTE — Discharge Instructions (Signed)
Follow with Primary MD  in 7 days   Get CBC, CMP,  checked  by Primary MD next visit.    Activity: As tolerated with Full fall precautions use walker/cane & assistance as needed   Disposition Home    Diet: Heart Healthy , with feeding assistance and aspiration precautions.  On your next visit with your primary care physician please Get Medicines reviewed and adjusted.   Please request your Prim.MD to go over all Hospital Tests and Procedure/Radiological results at the follow up, please get all Hospital records sent to your Prim MD by signing hospital release before you go home.   If you experience worsening of your admission symptoms, develop shortness of breath, life threatening emergency, suicidal or homicidal thoughts you must seek medical attention immediately by calling 911 or calling your MD immediately  if symptoms less severe.  You Must read complete instructions/literature along with all the possible adverse reactions/side effects for all the Medicines you take and that have been prescribed to you. Take any new Medicines after you have completely understood and accpet all the possible adverse reactions/side effects.   Do not drive, operating heavy machinery, perform activities at heights, swimming or participation in water activities or provide baby sitting services if your were admitted for syncope or siezures until you have seen by Primary MD or a Neurologist and advised to do so again.  Do not drive when taking Pain medications.    Do not take more than prescribed Pain, Sleep and Anxiety Medications  Special Instructions: If you have smoked or chewed Tobacco  in the last 2 yrs please stop smoking, stop any regular Alcohol  and or any Recreational drug use.  Wear Seat belts while driving.   Please note  You were cared for by a hospitalist during your hospital stay. If you have any questions about your discharge medications or the care you received while you were in the  hospital after you are discharged, you can call the unit and asked to speak with the hospitalist on call if the hospitalist that took care of you is not available. Once you are discharged, your primary care physician will handle any further medical issues. Please note that NO REFILLS for any discharge medications will be authorized once you are discharged, as it is imperative that you return to your primary care physician (or establish a relationship with a primary care physician if you do not have one) for your aftercare needs so that they can reassess your need for medications and monitor your lab values.  

## 2021-06-16 NOTE — Progress Notes (Signed)
Discharge instructions and B12 injection education given to patient as well as husband using teach back method. Patient being discharged to home

## 2022-01-20 ENCOUNTER — Encounter (HOSPITAL_BASED_OUTPATIENT_CLINIC_OR_DEPARTMENT_OTHER): Payer: Self-pay | Admitting: Emergency Medicine

## 2022-01-20 ENCOUNTER — Emergency Department (EMERGENCY_DEPARTMENT_HOSPITAL): Payer: Medicare Other

## 2022-01-20 ENCOUNTER — Inpatient Hospital Stay
Admission: EM | Admit: 2022-01-20 | Discharge: 2022-01-23 | DRG: 871 | Disposition: A | Discharge status: Home / Self Care | Payer: Medicare Other | Attending: Internal Medicine | Admitting: Internal Medicine

## 2022-01-20 ENCOUNTER — Other Ambulatory Visit: Payer: Self-pay

## 2022-01-20 DIAGNOSIS — J9611 Chronic respiratory failure with hypoxia: Secondary | ICD-10-CM

## 2022-01-20 DIAGNOSIS — Z20822 Contact with and (suspected) exposure to covid-19: Secondary | ICD-10-CM | POA: Diagnosis present

## 2022-01-20 DIAGNOSIS — Z7902 Long term (current) use of antithrombotics/antiplatelets: Secondary | ICD-10-CM

## 2022-01-20 DIAGNOSIS — R918 Other nonspecific abnormal finding of lung field: Secondary | ICD-10-CM

## 2022-01-20 DIAGNOSIS — F17210 Nicotine dependence, cigarettes, uncomplicated: Secondary | ICD-10-CM | POA: Diagnosis present

## 2022-01-20 DIAGNOSIS — Z720 Tobacco use: Secondary | ICD-10-CM

## 2022-01-20 DIAGNOSIS — A419 Sepsis, unspecified organism: Principal | ICD-10-CM | POA: Diagnosis present

## 2022-01-20 DIAGNOSIS — Z79899 Other long term (current) drug therapy: Secondary | ICD-10-CM

## 2022-01-20 DIAGNOSIS — F909 Attention-deficit hyperactivity disorder, unspecified type: Secondary | ICD-10-CM | POA: Diagnosis present

## 2022-01-20 DIAGNOSIS — J441 Chronic obstructive pulmonary disease with (acute) exacerbation: Secondary | ICD-10-CM | POA: Diagnosis present

## 2022-01-20 DIAGNOSIS — G8929 Other chronic pain: Secondary | ICD-10-CM | POA: Diagnosis present

## 2022-01-20 DIAGNOSIS — J9621 Acute and chronic respiratory failure with hypoxia: Secondary | ICD-10-CM | POA: Diagnosis present

## 2022-01-20 DIAGNOSIS — F1910 Other psychoactive substance abuse, uncomplicated: Secondary | ICD-10-CM | POA: Diagnosis present

## 2022-01-20 DIAGNOSIS — R0902 Hypoxemia: Secondary | ICD-10-CM | POA: Diagnosis present

## 2022-01-20 DIAGNOSIS — J9622 Acute and chronic respiratory failure with hypercapnia: Secondary | ICD-10-CM | POA: Diagnosis present

## 2022-01-20 DIAGNOSIS — J189 Pneumonia, unspecified organism: Secondary | ICD-10-CM | POA: Diagnosis present

## 2022-01-20 DIAGNOSIS — J440 Chronic obstructive pulmonary disease with acute lower respiratory infection: Secondary | ICD-10-CM | POA: Diagnosis present

## 2022-01-20 HISTORY — DX: Essential (primary) hypertension: I10

## 2022-01-20 HISTORY — DX: Acute myocardial infarction, unspecified (CMS HCC): I21.9

## 2022-01-20 HISTORY — DX: Chronic respiratory failure with hypoxia (CMS HCC): J96.11

## 2022-01-20 HISTORY — DX: Chronic obstructive pulmonary disease, unspecified (CMS HCC): J44.9

## 2022-01-20 HISTORY — DX: Heart failure, unspecified (CMS HCC): I50.9

## 2022-01-20 HISTORY — DX: Other psychoactive substance abuse, uncomplicated (CMS HCC): F19.10

## 2022-01-20 LAB — COMPREHENSIVE METABOLIC PANEL, NON-FASTING
ALBUMIN/GLOBULIN RATIO: 0.7 — ABNORMAL LOW (ref 0.8–1.4)
ALBUMIN: 2.6 g/dL — ABNORMAL LOW (ref 3.4–5.0)
ALKALINE PHOSPHATASE: 88 U/L (ref 46–116)
ALT (SGPT): 15 U/L (ref <=78)
ANION GAP: 7 mmol/L — ABNORMAL LOW (ref 10–20)
AST (SGOT): 27 U/L (ref 15–37)
BILIRUBIN TOTAL: 0.9 mg/dL (ref 0.2–1.0)
BUN/CREA RATIO: 26
BUN: 23 mg/dL — ABNORMAL HIGH (ref 7–18)
CALCIUM, CORRECTED: 9.5 mg/dL
CALCIUM: 8.1 mg/dL — ABNORMAL LOW (ref 8.5–10.1)
CHLORIDE: 107 mmol/L (ref 98–107)
CO2 TOTAL: 28 mmol/L (ref 21–32)
CREATININE: 0.89 mg/dL (ref 0.55–1.02)
ESTIMATED GFR: 74 mL/min/{1.73_m2} (ref >59)
GLOBULIN: 3.5
GLUCOSE: 77 mg/dL (ref 74–106)
OSMOLALITY, CALCULATED: 286 mOsm/kg (ref 270–290)
POTASSIUM: 4.5 mmol/L (ref 3.5–5.1)
PROTEIN TOTAL: 6.1 g/dL — ABNORMAL LOW (ref 6.4–8.2)
SODIUM: 142 mmol/L (ref 136–145)

## 2022-01-20 LAB — URINE DRUG SCREEN
AMPHETAMINES URINE: POSITIVE — AB
BARBITURATES URINE: NEGATIVE
BENZODIAZEPINES URINE: NEGATIVE
CANNABINOIDS URINE: NEGATIVE
COCAINE METABOLITES URINE: NEGATIVE
METHADONE URINE: NEGATIVE
OPIATES URINE: NEGATIVE
PCP URINE: NEGATIVE

## 2022-01-20 LAB — ARTERIAL BLOOD GAS/LACTATE
BASE EXCESS (ARTERIAL): 1.2 mmol/L
BASE EXCESS (ARTERIAL): -3.4 mmol/L
BICARBONATE (ARTERIAL): 28.9 mmol/L
BICARBONATE (ARTERIAL): 24.4 mmol/L
CARBOXYHEMOGLOBIN: 3.7 % — ABNORMAL HIGH (ref <=1.5)
CARBOXYHEMOGLOBIN: 2.5 % — ABNORMAL HIGH (ref <=1.5)
LACTATE: 0.7 mmol/L (ref <=4.0)
LACTATE: 0.8 mmol/L (ref <=4.0)
OXYHEMOGLOBIN: 89.5 % (ref 88.0–100.0)
OXYHEMOGLOBIN: 89.7 % (ref 88.0–100.0)
PCO2 (ARTERIAL): 58 mm/Hg — ABNORMAL HIGH (ref 35–45)
PCO2 (ARTERIAL): 54 mm/Hg — ABNORMAL HIGH (ref 35–45)
PH (ARTERIAL): 7.31 — ABNORMAL LOW (ref 7.35–7.45)
PH (ARTERIAL): 7.26 — ABNORMAL LOW (ref 7.35–7.45)
PO2 (ARTERIAL): 68 mm/Hg — ABNORMAL LOW (ref 80–100)
PO2 (ARTERIAL): 65 mm/Hg — ABNORMAL LOW (ref 80–100)

## 2022-01-20 LAB — ECG 12 LEAD: Atrial Rate: 105 {beats}/min

## 2022-01-20 LAB — CBC WITH DIFF
BASOPHIL #: 0.02 10*3/uL (ref 0.00–2.50)
BASOPHIL %: 0 % (ref 0–3)
EOSINOPHIL #: 0.06 10*3/uL (ref 0.00–2.40)
EOSINOPHIL %: 1 % (ref 0–7)
HCT: 49.9 % — ABNORMAL HIGH (ref 37.0–47.0)
HGB: 16.3 g/dL — ABNORMAL HIGH (ref 12.5–16.0)
LYMPHOCYTE #: 0.33 10*3/uL — ABNORMAL LOW (ref 2.10–11.00)
LYMPHOCYTE %: 4 % — ABNORMAL LOW (ref 25–45)
MCH: 31.4 pg (ref 27.0–32.0)
MCHC: 32.6 g/dL (ref 32.0–36.0)
MCV: 96.4 fL (ref 78.0–99.0)
MONOCYTE #: 0.15 10*3/uL (ref 0.00–4.10)
MONOCYTE %: 2 % (ref 0–12)
MPV: 10.1 fL (ref 7.4–10.4)
NEUTROPHIL #: 8.24 10*3/uL (ref 4.10–29.00)
NEUTROPHIL %: 94 % — ABNORMAL HIGH (ref 40–76)
PLATELETS: 126 10*3/uL — ABNORMAL LOW (ref 140–440)
RBC: 5.18 10*6/uL (ref 4.20–5.40)
RDW: 16.3 % — ABNORMAL HIGH (ref 11.6–14.8)
WBC: 8.8 10*3/uL (ref 4.0–10.5)

## 2022-01-20 LAB — URINALYSIS, MICROSCOPIC: WBCS: NONE SEEN /hpf

## 2022-01-20 LAB — URINALYSIS, MACROSCOPIC
BILIRUBIN: NEGATIVE mg/dL
GLUCOSE: NEGATIVE mg/dL
KETONES: NEGATIVE mg/dL
LEUKOCYTES: NEGATIVE WBCs/uL
NITRITE: NEGATIVE
PH: 5.5 (ref 4.6–8.0)
PROTEIN: NEGATIVE mg/dL
SPECIFIC GRAVITY: 1.01 (ref 1.003–1.035)
UROBILINOGEN: 0.2 mg/dL (ref 0.2–1.0)

## 2022-01-20 LAB — LACTIC ACID LEVEL W/ REFLEX FOR LEVEL >2.0: LACTIC ACID: 0.7 mmol/L (ref 0.4–2.0)

## 2022-01-20 LAB — COVID-19, FLU A/B, RSV RAPID BY PCR
INFLUENZA VIRUS TYPE A: NOT DETECTED
INFLUENZA VIRUS TYPE B: NOT DETECTED
RESPIRATORY SYNCTIAL VIRUS (RSV): NOT DETECTED
SARS-CoV-2: NOT DETECTED

## 2022-01-20 LAB — PT/INR
INR: 1.28 — ABNORMAL HIGH (ref 0.88–1.10)
PROTHROMBIN TIME: 13.6 seconds — ABNORMAL HIGH (ref 9.2–12.1)

## 2022-01-20 LAB — TROPONIN-I: TROPONIN I: 23 ng/L — ABNORMAL HIGH (ref <15)

## 2022-01-20 MED ORDER — VANCOMYCIN 1,000 MG INTRAVENOUS INJECTION
20.0000 mg/kg | INTRAVENOUS | Status: AC
Start: 2022-01-20 — End: 2022-01-20
  Administered 2022-01-20: 1250 mg via INTRAVENOUS
  Administered 2022-01-20: 0 mg via INTRAVENOUS
  Filled 2022-01-20: qty 20

## 2022-01-20 MED ORDER — KETOROLAC 30 MG/ML (1 ML) INJECTION SOLUTION
30.0000 mg | INTRAMUSCULAR | Status: AC
Start: 2022-01-20 — End: 2022-01-20
  Administered 2022-01-20: 30 mg via INTRAVENOUS

## 2022-01-20 MED ORDER — IPRATROPIUM 0.5 MG-ALBUTEROL 3 MG (2.5 MG BASE)/3 ML NEBULIZATION SOLN
3.0000 mL | INHALATION_SOLUTION | RESPIRATORY_TRACT | Status: AC
Start: 2022-01-20 — End: 2022-01-20
  Administered 2022-01-20: 3 mL via RESPIRATORY_TRACT

## 2022-01-20 MED ORDER — SODIUM CHLORIDE 0.9 % IV BOLUS
30.0000 mL/kg | INJECTION | Status: AC
Start: 2022-01-20 — End: 2022-01-20
  Administered 2022-01-20: 2721 mL via INTRAVENOUS
  Administered 2022-01-20: 0 mL via INTRAVENOUS

## 2022-01-20 MED ORDER — SODIUM CHLORIDE 0.9 % IV BOLUS
250.0000 mL | INJECTION | Status: AC
Start: 2022-01-20 — End: 2022-01-20
  Administered 2022-01-20: 250 mL via INTRAVENOUS
  Administered 2022-01-20: 0 mL via INTRAVENOUS

## 2022-01-20 MED ORDER — VANCOMYCIN 1,000 MG INTRAVENOUS INJECTION
INTRAVENOUS | Status: AC
Start: 2022-01-20 — End: 2022-01-20
  Filled 2022-01-20: qty 10

## 2022-01-20 MED ORDER — IPRATROPIUM 0.5 MG-ALBUTEROL 3 MG (2.5 MG BASE)/3 ML NEBULIZATION SOLN
INHALATION_SOLUTION | RESPIRATORY_TRACT | Status: AC
Start: 2022-01-20 — End: 2022-01-20
  Filled 2022-01-20: qty 3

## 2022-01-20 MED ORDER — SODIUM CHLORIDE 0.9 % INTRAVENOUS SOLUTION
INTRAVENOUS | Status: DC
Start: 2022-01-20 — End: 2022-01-21
  Administered 2022-01-20 – 2022-01-21 (×2): 0 mL via INTRAVENOUS

## 2022-01-20 MED ORDER — IPRATROPIUM 0.5 MG-ALBUTEROL 3 MG (2.5 MG BASE)/3 ML NEBULIZATION SOLN
INHALATION_SOLUTION | RESPIRATORY_TRACT | Status: AC
Start: 2022-01-20 — End: 2022-01-20
  Filled 2022-01-20: qty 9

## 2022-01-20 MED ORDER — METHYLPREDNISOLONE SOD SUCC 125 MG SOLUTION FOR INJECTION WRAPPER
125.0000 mg | INTRAVENOUS | Status: AC
Start: 2022-01-20 — End: 2022-01-20
  Administered 2022-01-20: 125 mg via INTRAVENOUS

## 2022-01-20 MED ORDER — SODIUM CHLORIDE 0.9 % INTRAVENOUS PIGGYBACK
4.5000 g | INTRAVENOUS | Status: AC
Start: 2022-01-20 — End: 2022-01-20
  Administered 2022-01-20: 0 g via INTRAVENOUS
  Administered 2022-01-20: 4.5 g via INTRAVENOUS

## 2022-01-20 MED ORDER — LACTATED RINGERS IV BOLUS
1000.0000 mL | INJECTION | Status: AC
Start: 2022-01-21 — End: 2022-01-21
  Administered 2022-01-20: 1000 mL via INTRAVENOUS
  Administered 2022-01-21: 0 mL via INTRAVENOUS

## 2022-01-20 MED ORDER — METHYLPREDNISOLONE SOD SUCC 125 MG SOLUTION FOR INJECTION WRAPPER
INTRAVENOUS | Status: AC
Start: 2022-01-20 — End: 2022-01-20
  Filled 2022-01-20: qty 2

## 2022-01-20 MED ORDER — PIPERACILLIN-TAZOBACTAM 4.5 GRAM INTRAVENOUS SOLUTION
INTRAVENOUS | Status: AC
Start: 2022-01-20 — End: 2022-01-20
  Filled 2022-01-20: qty 20

## 2022-01-20 MED ORDER — KETOROLAC 30 MG/ML (1 ML) INJECTION SOLUTION
INTRAMUSCULAR | Status: AC
Start: 2022-01-20 — End: 2022-01-20
  Filled 2022-01-20: qty 1

## 2022-01-20 NOTE — ED Nurses Note (Signed)
In room with patient.  Patient resting quietly in bed with eyes closed.  NSR on monitor.  O2 93% via NC.  Family at bedside.  IV infusing in bilateral AC w/o complication.  Patient roused easily to verbal stimuli.  Denies complaints of pain or discomfort.   States she would like coke to drink.  Patient repositioned in bed and given PO fluids.

## 2022-01-20 NOTE — ED Nurses Note (Signed)
Per nursing shift  report rcd from Doreene Adas, patient was turned to 3L via NC per order from MD.  Also ntd decrease to 3L in documentation from day shift. This was reported to ER MD who informed [facility] MD.  Ntd low blood pressure reading on monitor and in room with patient.  While rechecking BP, it was noted that patient is still on 10L via NC.  Attempted to turn O2 down 3L and patient O2 dropped to 84%.  Patient currently resting with her mouth open and breathing through her mouth.  Attemped to change delivery to oxy mask and saturation only increased to 89%.  Discussed with RT, who stated the previous MD instructed that her O2 be turned up to 10 as documented by RT.  Patient O2 increased to 10L via oxy mask.  Saturation improved to 94%.  Charge RN and MD notified of current O2 flow rate and delivery.

## 2022-01-20 NOTE — ED Nurses Note (Signed)
O2 sat dropped into 80's.  In room with patient, patient more difficult to wake and stimulate.  Discussed with provider and obtained order for abg.

## 2022-01-20 NOTE — ED Triage Notes (Addendum)
EMS states the pt has been in bed sleeping for 1.5 weeks but was up moving around last night. Has not been taking her Suboxone for several days, smell of urine on the pt. Upon arrival to the ER pt still has altered mental status. 18 gauge right forearm, 20 gauge left antecubital, EKG sinus tach, normal saline of 150 cc

## 2022-01-20 NOTE — ED Nurses Note (Signed)
O2 titrated to 5L via oxy mask.  Saturation remained between 92-94%.

## 2022-01-20 NOTE — ED Nurses Note (Signed)
Dr Alberteen Spindle updated on patient's condition including O2, BP, respiratory effort and rate. He states that it is fine to send the patient to the assigned bed.

## 2022-01-20 NOTE — ED Nurses Note (Signed)
O2 titrated to 8L via oxy mask.  O2 remains 92-95%.  Obtained current medication list and hx from patient's sons.  Patient has hx of COPD and CHF.   Lung sounds diminished bilaterally but not crackles ntd.

## 2022-01-20 NOTE — ED Provider Notes (Addendum)
[facility] Emergency Department  ED Primary Provider Note  History of Present Illness   Chief Complaint   Patient presents with   . Altered Mental Status     Brianna Mcgrath is a 61 y.o. female who had concerns including Altered Mental Status.  Patient was found by her husband unresponsive lying in bed.  Patient was unable to be awakened by the husband.  He then cold EMS who found the patient with a pulse ox in 50 on room air.  The husband stated that she has been lying in bed for a week and a half.  Patient has not taken her Suboxone and several days.  Patient has been having fever and chills for the past week.  Patient has been coughing nonproductive over the same period of time.  Patient is having myalgias and arthralgias.  Patient does have a history of COPD.  Patient is still smoking.  Patient presents to the ED with heart rate of 103 and respirations of 22.  Patient was put on 10 L nasal cannula which brought her pulse ox up to 93.  Patient also has a temperature of 103.5.  Patient denies any abdominal pain.  Patient denies any chest pain.  No history obtainable from the patient.  Arrival: The patient arrived by Ambulance    HPI  Review of Systems   Review of Systems   Constitutional: Positive for activity change, appetite change, fatigue and fever. Negative for chills.   HENT: Negative for ear pain and sore throat.    Eyes: Negative for pain and visual disturbance.   Respiratory: Positive for cough, chest tightness, shortness of breath and wheezing.    Cardiovascular: Negative for chest pain and palpitations.   Gastrointestinal: Negative for abdominal pain and vomiting.   Genitourinary: Positive for frequency and urgency. Negative for dysuria and hematuria.   Musculoskeletal: Negative for arthralgias and back pain.   Skin: Negative for color change and rash.   Neurological: Positive for weakness. Negative for seizures and syncope.   Psychiatric/Behavioral:  Positive for confusion.   All other systems reviewed and are negative.     Historical Data   History Reviewed This Encounter: Medical History  Surgical History  Family History  Social History      Physical Exam   ED Triage Vitals [01/20/22 1659]   BP (Non-Invasive) 114/85   Heart Rate (!) 103   Respiratory Rate (!) 22   Temperature (!) 39.7 C (103.5 F)   SpO2 93 %   Weight 90.7 kg (200 lb)   Height 1.575 m ('5\' 2"' )     Physical Exam  Vitals and nursing note reviewed.   Constitutional:       General: She is in acute distress.      Appearance: She is well-developed. She is obese. She is ill-appearing and toxic-appearing.   HENT:      Head: Normocephalic and atraumatic.      Right Ear: Tympanic membrane, ear canal and external ear normal.      Left Ear: Tympanic membrane, ear canal and external ear normal.      Nose: Nose normal.      Mouth/Throat:      Mouth: Mucous membranes are moist.   Eyes:      Extraocular Movements: Extraocular movements intact.      Conjunctiva/sclera: Conjunctivae normal.      Pupils: Pupils are equal, round, and reactive to light.   Cardiovascular:  Rate and Rhythm: Normal rate and regular rhythm.      Pulses: Normal pulses.      Heart sounds: No murmur heard.  Pulmonary:      Effort: Respiratory distress present.      Breath sounds: Wheezing present.      Comments: Inspiratory-expiratory wheezing throughout moving very little air at this time.  Patient is tachypneic also  Abdominal:      General: Bowel sounds are normal.      Palpations: Abdomen is soft.      Tenderness: There is no abdominal tenderness.   Musculoskeletal:         General: No swelling. Normal range of motion.      Cervical back: Normal range of motion and neck supple.   Skin:     General: Skin is warm and dry.      Capillary Refill: Capillary refill takes less than 2 seconds.   Neurological:      General: No focal deficit present.      Mental Status: She is alert.      Comments: Patient is oriented to person only.   Patient is disoriented to place and time.   Psychiatric:         Mood and Affect: Mood normal.         Behavior: Behavior normal.         Thought Content: Thought content normal.         Judgment: Judgment normal.       Patient Data     Labs Ordered/Reviewed   COMPREHENSIVE METABOLIC PANEL, NON-FASTING - Abnormal; Notable for the following components:       Result Value    ANION GAP 7 (*)     BUN 23 (*)     ALBUMIN 2.6 (*)     CALCIUM 8.1 (*)     PROTEIN TOTAL 6.1 (*)     ALBUMIN/GLOBULIN RATIO 0.7 (*)     All other components within normal limits    Narrative:     Estimated Glomerular Filtration Rate (eGFR) is calculated using the CKD-EPI (2021) equation, intended for patients 84 years of age and older. If gender is not documented or "unknown", there will be no eGFR calculation.   TROPONIN-I - Abnormal; Notable for the following components:    TROPONIN I 23 (*)     All other components within normal limits    Narrative:     Values received on females ranging between 12-15 ng/L MUST include the next serial troponin to review changes in the delta differences as the reference range for the Access II chemistry analyzer is lower than the established reference range.     ARTERIAL BLOOD GAS/LACTATE - Abnormal; Notable for the following components:    PH (ARTERIAL) 7.31 (*)     PCO2 (ARTERIAL) 58 (*)     CARBOXYHEMOGLOBIN 3.7 (*)     PO2 (ARTERIAL) 68 (*)     All other components within normal limits   PT/INR - Abnormal; Notable for the following components:    PROTHROMBIN TIME 13.6 (*)     INR 1.28 (*)     All other components within normal limits   CBC WITH DIFF - Abnormal; Notable for the following components:    HGB 16.3 (*)     HCT 49.9 (*)     RDW 16.3 (*)     PLATELETS 126 (*)     NEUTROPHIL % 94 (*)     LYMPHOCYTE % 4 (*)  LYMPHOCYTE # 0.33 (*)     All other components within normal limits   URINALYSIS, MACROSCOPIC - Abnormal; Notable for the following components:    BLOOD Small (*)     All other components  within normal limits   URINALYSIS, MICROSCOPIC - Abnormal; Notable for the following components:    RBCS 0-2 (*)     BACTERIA Occasional (*)     MUCOUS Occasional (*)     All other components within normal limits   URINE DRUG SCREEN - Abnormal; Notable for the following components:    AMPHETAMINES URINE Positive (*)     All other components within normal limits   ARTERIAL BLOOD GAS/LACTATE - Abnormal; Notable for the following components:    PH (ARTERIAL) 7.26 (*)     PCO2 (ARTERIAL) 54 (*)     CARBOXYHEMOGLOBIN 2.5 (*)     PO2 (ARTERIAL) 65 (*)     All other components within normal limits   LACTIC ACID LEVEL W/ REFLEX FOR LEVEL >2.0 - Normal   COVID-19, FLU A/B, RSV RAPID BY PCR - Normal    Narrative:     Results are for the simultaneous qualitative identification of SARS-CoV-2 (formerly 2019-nCoV), Influenza A, Influenza B, and RSV RNA. These etiologic agents are generally detectable in nasopharyngeal and nasal swabs during the ACUTE PHASE of infection. Hence, this test is intended to be performed on respiratory specimens collected from individuals with signs and symptoms of upper respiratory tract infection who meet Centers for Disease Control and Prevention (CDC) clinical and/or epidemiological criteria for Coronavirus Disease 2019 (COVID-19) testing. CDC COVID-19 criteria for testing on human specimens is available at [facility] webpage information for Healthcare Professionals: Coronavirus Disease 2019 (COVID-19) ([email or web address]).     False-negative results may occur if the virus has genomic mutations, insertions, deletions, or rearrangements or if performed very early in the course of illness. Otherwise, negative results indicate virus specific RNA targets are not detected, however negative results do not preclude SARS-CoV-2 infection/COVID-19, Influenza, or Respiratory syncytial virus infection. Results should not be used as the sole basis for patient management  decisions. Negative results must be combined with clinical observations, patient history, and epidemiological information. If upper respiratory tract infection is still suspected based on exposure history together with other clinical findings, re-testing should be considered.    Disclaimer:   This assay has been authorized by FDA under an Emergency Use Authorization for use in laboratories certified under the Clinical Laboratory Improvement Amendments of 1988 (CLIA), 42 U.S.C. [phone], to perform high complexity tests. The impacts of vaccines, antiviral therapeutics, antibiotics, chemotherapeutic or immunosuppressant drugs have not been evaluated.     Test methodology:   Cepheid Xpert Xpress SARS-CoV-2/Flu/RSV Assay real-time polymerase chain reaction (RT-PCR) test on the GeneXpert Dx and Xpert Xpress systems.   ADULT ROUTINE BLOOD CULTURE, SET OF 2 BOTTLES (BACTERIA AND YEAST)   ADULT ROUTINE BLOOD CULTURE, SET OF 2 BOTTLES (BACTERIA AND YEAST)   CBC/DIFF    Narrative:     The following orders were created for panel order CBC/DIFF.  Procedure                               Abnormality         Status                     ---------                               -----------         ------  CBC WITH DIFF[[id number]]                Abnormal            Final result                 Please view results for these tests on the individual orders.   URINALYSIS, MACROSCOPIC AND MICROSCOPIC W/CULTURE REFLEX    Narrative:     The following orders were created for panel order URINALYSIS, MACROSCOPIC AND MICROSCOPIC W/CULTURE REFLEX.  Procedure                               Abnormality         Status                     ---------                               -----------         ------                     URINALYSIS, MACROSCOPIC[[id number]]      Abnormal            Final result               URINALYSIS, MICROSCOPIC[[id number]]      Abnormal            Final result                 Please view results for these tests on  the individual orders.     XR AP MOBILE CHEST   Final Result by Lyndle Herrlich, RT (R) (03/05 2122)        Medical Decision Making        Medical Decision Making  Patient is a 61 year old white female who was brought in by EMS with sepsis like symptoms with tachypnea tachycardia and pulse ox around 50.  Patient also has a temperature 103.  Patient will be treated as though she was septic with exacerbation of COPD and pneumonia.  Patient also will be given 30 cc/kilograms of normal saline.  Patient will be started on antibiotics.  Patient will also be given several DuoNebs and Solu-Medrol IV.  Labs and x-ray will be done.  Once I find her labs and x-ray results that will then determine where the patient is admitted to and for what.  Patient chest x-ray shows a large infiltrate left upper as well as lower lobes.  Her gas also shows some hypoxemia on 11 L the patient is 7.31/pCO2 58/PO2 68.  Patient will be admitted [facility] to [facility].  I will put in a bed request for the hospitalist at [facility].  Patient also has exacerbation of COPD with hypoxemia.  Patient has acute respiratory failure with hypoxemia.        Care of the patient was handed off to Dr. Carlee Vonderhaar at 7:30 p.m.Marland Kitchen  Please refer to their ED course note for further details on patient's ED course, further imaging, ultimately patient disposition.    Amount and/or Complexity of Data Reviewed  Labs: ordered.  Radiology: ordered.  ECG/medicine tests: ordered.     Details: Sinus tachycardia 105, PR interval 1-2 MS, QT interval 336 MS, otherwise unremarkable      Risk  Prescription drug management.  Decision regarding  hospitalization.    Risk Details: Critical Care Time:  Total critical care time spent in direct care of this patient at high risk based on presenting history/exam/and complaint, including initial evaluation and stabilization, review of data, re-examination, discussion with admitting and consulting services to arrange definitive  care, and exclusive of any procedures performed, was 45 minutes.    While patient was awaiting transfer to [facility] hospital patient's blood pressure is trending down to the high 99J systolic.  I have repeated her ABG and patient will be placed on BiPAP.  She is more awake and alert at this time.  A L bolus of lactated Ringer's has been ordered.  I have [facility] these [facility], in the hospital the patient will be evaluated for ICU bed at this time.  We will continue treating the patient until a bed becomes available         Medications Administered in the ED   NS bolus infusion 30 mL/kg = 2,721 mL (2,721 mL Intravenous New Bag/New Syringe 01/20/22 1754)   ketorolac (TORADOL) 30 mg/mL injection (30 mg Intravenous Given 01/20/22 1756)   ipratropium-albuterol 0.5 mg-3 mg(2.5 mg base)/3 mL Solution for Nebulization (3 mL Nebulization Given 01/20/22 1731)   ipratropium-albuterol 0.5 mg-3 mg(2.5 mg base)/3 mL Solution for Nebulization (3 mL Nebulization Given 01/20/22 1731)   ipratropium-albuterol 0.5 mg-3 mg(2.5 mg base)/3 mL Solution for Nebulization (3 mL Nebulization Given 01/20/22 1730)   methylPREDNISolone sod succ (SOLU-MEDROL) 125 mg/2 mL injection (125 mg Intravenous Given 01/20/22 1757)   piperacillin-tazobactam (ZOSYN) 4.5 g in NS 100 mL IVPB minibag (4.5 g Intravenous New Bag/New Syringe 01/20/22 1802)   vancomycin (VANCOCIN) 1,250 mg in NS 250 mL IVPB (1,250 mg Intravenous New Bag/New Syringe 01/20/22 1811)     Clinical Impression   Pneumonia of left lower lobe due to infectious organism (Primary)   Hypoxemia   Chronic hypoxemic respiratory failure (CMS HCC)   Sepsis, due to unspecified organism, unspecified whether acute organ dysfunction present (CMS HCC)   Acute exacerbation of chronic obstructive pulmonary disease (COPD) (CMS HCC)       Disposition: Admitted      patient care was discussed with parents in [facility] hospitalist Dr. Deloria Lair.  Patient was accepted for  transfer awaiting bed assignment

## 2022-01-21 ENCOUNTER — Other Ambulatory Visit: Payer: Self-pay

## 2022-01-21 ENCOUNTER — Encounter (HOSPITAL_COMMUNITY): Payer: Self-pay | Admitting: Internal Medicine

## 2022-01-21 DIAGNOSIS — Z9989 Dependence on other enabling machines and devices: Secondary | ICD-10-CM

## 2022-01-21 DIAGNOSIS — J9621 Acute and chronic respiratory failure with hypoxia: Secondary | ICD-10-CM

## 2022-01-21 DIAGNOSIS — J9622 Acute and chronic respiratory failure with hypercapnia: Secondary | ICD-10-CM

## 2022-01-21 DIAGNOSIS — F1910 Other psychoactive substance abuse, uncomplicated: Secondary | ICD-10-CM

## 2022-01-21 LAB — CBC WITH DIFF
BASOPHIL #: 0 10*3/uL (ref 0.00–2.50)
BASOPHIL %: 0 % (ref 0–3)
EOSINOPHIL #: 0 10*3/uL (ref 0.00–2.40)
EOSINOPHIL %: 0 % (ref 0–7)
HCT: 41.9 % (ref 37.0–47.0)
HGB: 13.6 g/dL (ref 12.5–16.0)
LYMPHOCYTE #: 0.5 10*3/uL — ABNORMAL LOW (ref 2.10–11.00)
LYMPHOCYTE %: 4 % — ABNORMAL LOW (ref 25–45)
MCH: 31 pg (ref 27.0–32.0)
MCHC: 32.5 g/dL (ref 32.0–36.0)
MCV: 95.3 fL (ref 78.0–99.0)
MONOCYTE #: 0.5 10*3/uL (ref 0.00–4.10)
MONOCYTE %: 5 % (ref 0–12)
MPV: 10.9 fL — ABNORMAL HIGH (ref 7.4–10.4)
NEUTROPHIL #: 10.9 10*3/uL (ref 4.10–29.00)
NEUTROPHIL %: 91 % — ABNORMAL HIGH (ref 40–76)
PLATELET COMMENT: ADEQUATE
PLATELETS: 104 10*3/uL — ABNORMAL LOW (ref 140–440)
RBC: 4.39 10*6/uL (ref 4.20–5.40)
RDW: 15.1 % — ABNORMAL HIGH (ref 11.6–14.8)
WBC: 12 10*3/uL — ABNORMAL HIGH (ref 4.0–10.5)
WBCS UNCORRECTED: 12 10*3/uL

## 2022-01-21 LAB — BASIC METABOLIC PANEL
ANION GAP: 5 mmol/L — ABNORMAL LOW (ref 10–20)
BUN/CREA RATIO: 29 — ABNORMAL HIGH (ref 6–22)
BUN: 19 mg/dL (ref 7–25)
CALCIUM: 7.3 mg/dL — ABNORMAL LOW (ref 8.6–10.3)
CHLORIDE: 111 mmol/L — ABNORMAL HIGH (ref 98–107)
CO2 TOTAL: 25 mmol/L (ref 21–31)
CREATININE: 0.66 mg/dL (ref 0.60–1.30)
ESTIMATED GFR: 100 mL/min/{1.73_m2} (ref >59)
GLUCOSE: 132 mg/dL — ABNORMAL HIGH (ref 74–109)
OSMOLALITY, CALCULATED: 285 mOsm/kg (ref 270–290)
POTASSIUM: 4.3 mmol/L (ref 3.5–5.1)
SODIUM: 141 mmol/L (ref 136–145)

## 2022-01-21 LAB — C-REACTIVE PROTEIN (CRP): C-REACTIVE PROTEIN (CRP): 8.7 mg/dL — ABNORMAL HIGH (ref 0.1–0.5)

## 2022-01-21 LAB — ECG 12 LEAD
Calculated P Axis: 77 degrees
Calculated R Axis: -13 degrees
Calculated T Axis: 31 degrees
PR Interval: 122 ms
QRS Duration: 70 ms
QT Interval: 336 ms
QTC Calculation: 444 ms
Ventricular rate: 105 {beats}/min

## 2022-01-21 MED ORDER — SODIUM CHLORIDE 0.9 % INTRAVENOUS SOLUTION
Freq: Once | INTRAVENOUS | Status: DC
Start: 2022-01-21 — End: 2022-01-21
  Administered 2022-01-21: 0 mL via INTRAVENOUS

## 2022-01-21 MED ORDER — DOXYCYCLINE HYCLATE 100 MG TABLET
100.0000 mg | ORAL_TABLET | Freq: Two times a day (BID) | ORAL | Status: DC
Start: 2022-01-21 — End: 2022-01-23
  Administered 2022-01-21 – 2022-01-23 (×5): 100 mg via ORAL
  Filled 2022-01-21 (×5): qty 1

## 2022-01-21 MED ORDER — GUAIFENESIN 100 MG/5 ML ORAL LIQUID
200.0000 mg | Freq: Four times a day (QID) | ORAL | Status: DC | PRN
Start: 2022-01-21 — End: 2022-01-23
  Administered 2022-01-22: 200 mg via ORAL
  Filled 2022-01-21: qty 10

## 2022-01-21 MED ORDER — SODIUM CHLORIDE 0.9 % INTRAVENOUS PIGGYBACK
2.0000 g | INTRAVENOUS | Status: DC
Start: 2022-01-21 — End: 2022-01-21
  Administered 2022-01-21: 2 g via INTRAVENOUS
  Administered 2022-01-21: 0 g via INTRAVENOUS
  Filled 2022-01-21: qty 20

## 2022-01-21 MED ORDER — IPRATROPIUM 0.5 MG-ALBUTEROL 3 MG (2.5 MG BASE)/3 ML NEBULIZATION SOLN
3.0000 mL | INHALATION_SOLUTION | RESPIRATORY_TRACT | Status: DC
Start: 2022-01-21 — End: 2022-01-21
  Administered 2022-01-21 (×2): 3 mL via RESPIRATORY_TRACT
  Administered 2022-01-21: 0 mL via RESPIRATORY_TRACT

## 2022-01-21 MED ORDER — PNEUMOCOCCAL 23 POLYVALENT VACCINE 25 MCG/0.5 ML INJECTION SOLUTION
0.5000 mL | Freq: Once | INTRAMUSCULAR | Status: DC
Start: 2022-01-21 — End: 2022-01-21
  Administered 2022-01-21: 0 mL via INTRAMUSCULAR

## 2022-01-21 MED ORDER — NICOTINE 21 MG/24 HR DAILY TRANSDERMAL PATCH
21.0000 mg | MEDICATED_PATCH | Freq: Every day | TRANSDERMAL | Status: DC
Start: 2022-01-21 — End: 2022-01-23
  Administered 2022-01-21 – 2022-01-22 (×2): 21 mg via TRANSDERMAL
  Filled 2022-01-21 (×2): qty 1

## 2022-01-21 MED ORDER — ENOXAPARIN 40 MG/0.4 ML SUBCUTANEOUS SYRINGE
40.0000 mg | INJECTION | SUBCUTANEOUS | Status: DC
Start: 2022-01-21 — End: 2022-01-23
  Administered 2022-01-21 – 2022-01-23 (×3): 40 mg via SUBCUTANEOUS
  Filled 2022-01-21 (×3): qty 0.4

## 2022-01-21 MED ORDER — PNEUMOCOCCAL 23 POLYVALENT VACCINE 25 MCG/0.5 ML INJECTION SOLUTION
0.5000 mL | Freq: Once | INTRAMUSCULAR | Status: DC | PRN
Start: 2022-01-21 — End: 2022-01-23

## 2022-01-21 MED ORDER — ACETAMINOPHEN 325 MG TABLET
650.0000 mg | ORAL_TABLET | Freq: Four times a day (QID) | ORAL | Status: DC | PRN
Start: 2022-01-21 — End: 2022-01-23

## 2022-01-21 MED ORDER — ONDANSETRON HCL (PF) 4 MG/2 ML INJECTION SOLUTION
4.0000 mg | Freq: Three times a day (TID) | INTRAMUSCULAR | Status: DC | PRN
Start: 2022-01-21 — End: 2022-01-23
  Administered 2022-01-21: 4 mg via INTRAVENOUS
  Filled 2022-01-21: qty 2

## 2022-01-21 MED ORDER — ARFORMOTEROL 15 MCG/2 ML SOLUTION FOR NEBULIZATION
15.0000 ug | INHALATION_SOLUTION | Freq: Two times a day (BID) | RESPIRATORY_TRACT | Status: DC
Start: 2022-01-21 — End: 2022-01-23
  Administered 2022-01-21: 0 ug via RESPIRATORY_TRACT
  Administered 2022-01-21 – 2022-01-23 (×4): 15 ug via RESPIRATORY_TRACT

## 2022-01-21 MED ORDER — BUPRENORPHINE 2 MG-NALOXONE 0.5 MG SUBLINGUAL TABLET
1.0000 | SUBLINGUAL_TABLET | Freq: Every day | SUBLINGUAL | Status: DC
Start: 2022-01-21 — End: 2022-01-23
  Administered 2022-01-21 – 2022-01-23 (×3): 1 via SUBLINGUAL
  Filled 2022-01-21 (×3): qty 1

## 2022-01-21 MED ORDER — IPRATROPIUM 0.5 MG-ALBUTEROL 3 MG (2.5 MG BASE)/3 ML NEBULIZATION SOLN
3.0000 mL | INHALATION_SOLUTION | RESPIRATORY_TRACT | Status: DC | PRN
Start: 2022-01-21 — End: 2022-01-23

## 2022-01-21 MED ORDER — PREDNISONE 20 MG TABLET
40.0000 mg | ORAL_TABLET | Freq: Every morning | ORAL | Status: DC
Start: 2022-01-21 — End: 2022-01-23
  Administered 2022-01-21 – 2022-01-23 (×3): 40 mg via ORAL
  Filled 2022-01-21 (×3): qty 2

## 2022-01-21 MED ORDER — SODIUM CHLORIDE 0.9 % INTRAVENOUS PIGGYBACK
100.0000 mg | Freq: Two times a day (BID) | INTRAVENOUS | Status: DC
Start: 2022-01-21 — End: 2022-01-21
  Administered 2022-01-21: 100 mg via INTRAVENOUS
  Administered 2022-01-21: 0 mg via INTRAVENOUS
  Filled 2022-01-21: qty 10

## 2022-01-21 NOTE — ED Nurses Note (Signed)
Patient has had O2 between 93-96%.  RT at bedside.  O2 decreased to 40%.  [facility] called to transport patient.

## 2022-01-21 NOTE — H&P (Signed)
[facility]  History and Physical    Date of Service:  01/21/2022  Brianna Mcgrath, Brianna Mcgrath, 61 y.o. female  Encounter Start Date:  01/20/2022  Inpatient Admission Date: 01/20/2022  Date of Birth:  10/21/1961  PCP: No Pcp            Chief Complaint:  Hypoxia, cough, found unresponsive    HPI: Brianna Mcgrath is a 61 y.o., White female who initially presents to the emergency department yesterday on January 20, 2022 with chief complaint of having been found unresponsive, hypoxemic cough.  Patient case was discussed with the ED physician at [facility] emergency department, patient remained directly accept the patient for admission.  The patient was seen examined at bedside shortly following her arrival from [facility] the ED.  Patient is unsure what had occurred.  Patient tells me she does use supplemental oxygen at home at 3 L at all times, she does tell me that she has a smoker.  She says she does have COPD.  Report of the patient, the patient was brought to the ED by her family, it found her unresponsive.  Patient denies any focal weakness or tingling numbness.  She is unsure when care.  Per report from the ED physician, the patient was quite hypoxic at presentation to the ED, she was requiring 10 L of supplemental oxygen at 1 point patient is also having borderline hypotension with the ED as well despite having received fluid resuscitation.    Past Medical History:    Past Medical History:   Diagnosis Date   . Chronic hypoxemic respiratory failure (CMS HCC)    . Congestive heart failure (CMS HCC)    . COPD (chronic obstructive pulmonary disease) (CMS HCC)    . Drug abuse (CMS [place])    . HTN (hypertension)    . Myocardial infarction (CMS HCC)              Medications Prior to Admission     Prescriptions    albuterol sulfate 90 mcg/Actuation Inhalation HFA Aerosol Inhaler oral inhaler    Take 1-2 Puffs by inhalation Every 6 hours as needed    Amlodipine-Benazepril 10-40 mg Oral Capsule    Take 1 Capsule by mouth Twice  daily    atorvastatin (LIPITOR) 40 mg Oral Tablet    Take 1 Tablet (40 mg total) by mouth Every evening    budesonide-formoteroL (SYMBICORT) 160-4.5 mcg/actuation Inhalation oral inhaler    Take 2 Puffs by inhalation Twice daily    buprenorphine-naloxone (SUBOXONE) 8-2 mg Sublingual Tablet, Sublingual    Place under the tongue Twice daily    clopidogreL (PLAVIX) 75 mg Oral Tablet    Take 1 Tablet (75 mg total) by mouth Once a day    lisdexamfetamine (VYVANSE) 70 mg Oral Capsule    Take 1 Capsule (70 mg total) by mouth Once a day    pregabalin (LYRICA) 200 mg Oral Capsule    Take 1 Capsule (200 mg total) by mouth One time    QUEtiapine (SEROQUEL) 25 mg Oral Tablet    Take 1 Tablet (25 mg total) by mouth Every night        No Known Allergies    Past Surgical History:  Hysterectomy        Family History:  Family Medical History:     Problem Relation (Age of Onset)    Heart Attack Other    Lung Cancer Other  Social History:  Social History     Tobacco Use   . Smoking status: Every Day     Packs/day: 1.00     Types: Cigarettes   Substance Use Topics   . Drug use: Yes     Types: Amphetamine     Comment: Patient denies however UDS positive        Review of Systems:  All systems are reviewed and are negative except those mentioned in the HPI portion    Examination:  BP (!) 86/63   Pulse 72   Temp 36.2 C (97.1 F)   Resp 18   Ht 1.575 m (_0 )   Wt 69.5 kg (153 lb 4.8 oz)   SpO2 95%   BMI 28.04 kg/m         General: Patient is alert and oriented to person, place and time.    HEENT: Pupils are of round shape, equal in size, and reactive to light bilaterally.     Heart: S1 and S2 are present. No appreciable murmur.    Lungs:  Patient has mild wheezing bilateral posterior lung fields, there are also rales appreciated.  She is currently using BiPAP.    Gastrointestinal: Bowel sounds are appreciated at all 4 quadrants. Abdomen is soft, not appreciably distended, non-tender to palpation at all  quadrants.    Extremities: Radial pulses are 2/4 bilaterally, dorsalis pedis pulses are 2/4 bilaterally. Capillary refill is less than 3 seconds at distal digits bilaterally. No appreciable edema of the lower extremities.     Genitourinary: No appreciable suprapubic tenderness.    Neurologic: Follows commands appropriately. No appreciable facial droop. No appreciable focal weakness of the bilateral upper or lower extremities.    Skin: Grossly intact at observable areas.    Labs:    Lab Results Today:    Results for orders placed or performed during the hospital encounter of 01/20/22 (from the past 24 hour(s))   COMPREHENSIVE METABOLIC PANEL, NON-FASTING   Result Value Ref Range    SODIUM 142 136 - 145 mmol/L    POTASSIUM 4.5 3.5 - 5.1 mmol/L    CHLORIDE 107 98 - 107 mmol/L    CO2 TOTAL 28 21 - 32 mmol/L    ANION GAP 7 (L) 10 - 20 mmol/L    BUN 23 (H) 7 - 18 mg/dL    CREATININE 0.89 0.55 - 1.02 mg/dL    BUN/CREA RATIO 26     ESTIMATED GFR 74 >59 mL/min/1.25m2    ALBUMIN 2.6 (L) 3.4 - 5.0 g/dL    CALCIUM 8.1 (L) 8.5 - 10.1 mg/dL    GLUCOSE 77 74 - 106 mg/dL    ALKALINE PHOSPHATASE 88 46 - 116 U/L    ALT (SGPT) 15 <=78 U/L    AST (SGOT) 27 15 - 37 U/L    BILIRUBIN TOTAL 0.9 0.2 - 1.0 mg/dL    PROTEIN TOTAL 6.1 (L) 6.4 - 8.2 g/dL    ALBUMIN/GLOBULIN RATIO 0.7 (L) 0.8 - 1.4    OSMOLALITY, CALCULATED 286 270 - 290 mOsm/kg    CALCIUM, CORRECTED 9.5 mg/dL    GLOBULIN 3.5    TROPONIN-I   Result Value Ref Range    TROPONIN I 23 (H) <15 ng/L   ECG 12 LEAD   Result Value Ref Range    Ventricular rate 105 BPM    Atrial Rate 105 BPM    PR Interval 122 ms    QRS Duration 70 ms    QT Interval 336 ms  QTC Calculation 444 ms    Calculated P Axis 77 degrees    Calculated R Axis -13 degrees    Calculated T Axis 31 degrees   LACTIC ACID LEVEL W/ REFLEX FOR LEVEL >2.0   Result Value Ref Range    LACTIC ACID 0.7 0.4 - 2.0 mmol/L   PT/INR   Result Value Ref Range    PROTHROMBIN TIME 13.6 (H) 9.2 - 12.1 seconds    INR 1.28 (H) 0.88 -  1.10   CBC WITH DIFF   Result Value Ref Range    WBCS UNCORRECTED      WBC 8.8 4.0 - 10.5 x10^3/uL    RBC 5.18 4.20 - 5.40 x10^6/uL    HGB 16.3 (H) 12.5 - 16.0 g/dL    HCT 49.9 (H) 37.0 - 47.0 %    MCV 96.4 78.0 - 99.0 fL    MCH 31.4 27.0 - 32.0 pg    MCHC 32.6 32.0 - 36.0 g/dL    RDW 16.3 (H) 11.6 - 14.8 %    PLATELETS 126 (L) 140 - 440 x10^3/uL    MPV 10.1 7.4 - 10.4 fL    NEUTROPHIL % 94 (H) 40 - 76 %    LYMPHOCYTE % 4 (L) 25 - 45 %    MONOCYTE % 2 0 - 12 %    EOSINOPHIL % 1 0 - 7 %    BASOPHIL % 0 0 - 3 %    NEUTROPHIL # 8.24 4.10 - 29.00 x10^3/uL    LYMPHOCYTE # 0.33 (L) 2.10 - 11.00 x10^3/uL    MONOCYTE # 0.15 0.00 - 4.10 x10^3/uL    EOSINOPHIL # 0.06 0.00 - 2.40 x10^3/uL    BASOPHIL # 0.02 0.00 - 2.50 x10^3/uL   COVID-19, FLU A/B, RSV RAPID BY PCR   Result Value Ref Range    SARS-CoV-2 Not Detected Not Detected    INFLUENZA VIRUS TYPE A Not Detected Not Detected    INFLUENZA VIRUS TYPE B Not Detected Not Detected    RESPIRATORY SYNCTIAL VIRUS (RSV) Not Detected Not Detected   ARTERIAL BLOOD GAS/LACTATE   Result Value Ref Range    PH (ARTERIAL) 7.31 (L) 7.35 - 7.45    PCO2 (ARTERIAL) 58 (H) 35 - 45 mm/Hg    BICARBONATE (ARTERIAL) 28.9 mmol/L    BASE EXCESS (ARTERIAL) 1.2 mmol/L    MET-HEMOGLOBIN      LACTATE 0.7 <=4.0 mmol/L    CARBOXYHEMOGLOBIN 3.7 (H) <=1.5 %    O2CT      %FIO2 (ARTERIAL)      PO2 (ARTERIAL) 68 (L) 80 - 100 mm/Hg    OXYHEMOGLOBIN 89.5 88.0 - 100.0 %    ALLEN TEST      DRAW SITE     URINALYSIS, MACROSCOPIC   Result Value Ref Range    COLOR Light Yellow Yellow, Colorless, Light Yellow, Dark Yellow    APPEARANCE Clear Clear    SPECIFIC GRAVITY 1.010 1.003 - 1.035    PH 5.5 4.6 - 8.0    LEUKOCYTES Negative Negative WBCs/uL    NITRITE Negative Negative    PROTEIN Negative Negative mg/dL    GLUCOSE Negative Negative mg/dL    KETONES Negative Negative mg/dL    BILIRUBIN Negative Negative mg/dL    BLOOD Small (A) Negative mg/dL    UROBILINOGEN 0.2 0.2 - 1.0 mg/dL   URINALYSIS, MICROSCOPIC   Result  Value Ref Range    RBCS 0-2 (A) None seen /hpf    BACTERIA Occasional (A) Occassional  of less /hpf /hpf    MUCOUS Occasional (A) (none) /hpf    WBCS None seen None seen, Rare, 0-2, 2-5 /hpf    SQUAMOUS EPITHELIAL 0-2 None seen, Rare, 0-2, 2-5 /hpf   URINE DRUG SCREEN   Result Value Ref Range    AMPHETAMINES URINE Positive (A) Negative    BARBITURATES URINE Negative Negative    BENZODIAZEPINES URINE Negative Negative    METHADONE URINE Negative Negative    COCAINE METABOLITES URINE Negative Negative    OPIATES URINE Negative Negative    PCP URINE Negative Negative    CANNABINOIDS URINE Negative Negative   ARTERIAL BLOOD GAS/LACTATE   Result Value Ref Range    PH (ARTERIAL) 7.26 (L) 7.35 - 7.45    PCO2 (ARTERIAL) 54 (H) 35 - 45 mm/Hg    BICARBONATE (ARTERIAL) 24.4 mmol/L    BASE EXCESS (ARTERIAL) -3.4 mmol/L    MET-HEMOGLOBIN      LACTATE 0.8 <=4.0 mmol/L    CARBOXYHEMOGLOBIN 2.5 (H) <=1.5 %    O2CT      %FIO2 (ARTERIAL)      PO2 (ARTERIAL) 65 (L) 80 - 100 mm/Hg    OXYHEMOGLOBIN 89.7 88.0 - 100.0 %    ALLEN TEST      DRAW SITE         Imaging Studies: XR AP MOBILE CHEST  READ BY NIGHTHAWK   ECG 12 LEAD  Sinus tachycardia  Otherwise normal ECG  No previous ECGs available       Assessment/Plan:   Active Hospital Problems    Diagnosis   . Primary Problem: Sepsis (CMS [place])   . Substance abuse (CMS [place])   . Pneumonia   . Acute on chronic respiratory failure with hypoxia and hypercapnia (CMS HCC)     Patient did meet criteria for sepsis at presentation to [facility] emergency department, source this time is pneumonia.  Patient did have a urinalysis as well as urine drug screen, this was positive for amphetamine, the assisting were pharmacy control substance monitoring program search not reveal stimulants at work also flag for this, question whether the patient's  unresponsive was multifactorial related to her hypoxemia as well as substance abuse.  Patient which she her pneumonia with ceftriaxone  2g IV every 24 hours as well  as doxycycline 100 mg IV every 12 hours.  She will monitor on continuous pulse oximetry, currently using BiPAP and tolerating this well.  Patient's blood pressure was initially borderline and [place] ED, currently her map is 70 at bedside during the encounter.  Patient started on prednisone 40 mg p.o. Q 24 H 10 daily for 5 days.  Patient was admitted for COPD exacerbation with glucocorticoids as mentioned previously, she also received DuoNeb 3 mL HHN every 4 hours.  She will have morning laboratory studies this morning as well.  Further changes to her plan of care dependent upon her clinical course.      DVT/PE Prophylaxis: Enoxaparin    Dayton Bailiff, DO    Contents of the document, in whole or in part, are completed utilizing M*Modal dictation technology, please forgive any typographical errors that may exist.

## 2022-01-21 NOTE — Ancillary Notes (Signed)
PT HAD THREE DUONEB TX. [place] RT CHARTED TXs BUT DID NOT ADD HR, RR, O2 SAT  TX AT  1730  HR107-100  RR 24-28 SAT 92-96  1737  HR 100-98  RR 28-28  SAT 96-96  1744  HR 100-98  RR 28-26  ST 95-96  ALL TX WITH AIR AND MASK. PT ON 11 LPM NC. ALL OTHER INFO CHARTED

## 2022-01-21 NOTE — ED Nurses Note (Signed)
Patient tolerating BiPAP well.  Settings 10/5, rate of 20 and 50%.  VSS.

## 2022-01-21 NOTE — ED Nurses Note (Signed)
Patient's husband updated on patient condition notified patient is now going to the unit.

## 2022-01-21 NOTE — Progress Notes (Addendum)
[facility]  IP PROGRESS NOTE      Brianna Mcgrath, Brianna Mcgrath  Date of Admission:  01/20/2022  Date of Birth:  [birth year]  Date of Service:  01/21/2022    Subjective:   Pt has been off NIV since this am, currently on 3 L oxymask, sat 96%.         Physical Exam:    Vitals:    01/21/22 0800 01/21/22 0810 01/21/22 0900 01/21/22 1000   BP: 106/66  112/69 103/72   Pulse: 73  72 70   Resp: 15  17 17    Temp: 36.4 C (97.6 F)      SpO2: 95% 98% 95% 96%   Weight:       Height:       BMI:           Gen: NAD, thin  Skin: warm, dry  HENT: NC/AT, PERRL, normal conjunctivae, moist oral mucosa, Mall 2  Neck: normal circumference, no JVD, no adenopathy  Chest: NL breathing, clear BSs bilaterally  Cor: S1/S2, RRR, no murmurs  Abd: normal bowel sounds, soft, NT  GU: foley in place, clear yellow urine  Ext: no LE edema, 2+ radial pulses  Neuro: AAOx3, following commands  Psych: normal mood and affect      CBC Results Coag Results   Recent Labs     01/20/22  1727 01/21/22  0607   WBC 8.8 12.0*   HGB 16.3* 13.6   HCT 49.9* 41.9   PLTCNT 126* 104*     Recent Labs     01/20/22  1727 01/21/22  0607   RBC 5.18 4.39   HCT 49.9* 41.9   HGB 16.3* 13.6   WBC 8.8 12.0*   MCHC 32.6 32.5   MCH 31.4 31.0   RDW 16.3* 15.1*   MPV 10.1 10.9*   MCV 96.4 95.3   PLTCNT 126* 104*    Recent Labs     01/20/22  1727   PROTHROMTME 13.6*   INR 1.28*     Recent Labs     01/20/22  1727   INR 1.28*   PROTHROMTME 13.6*        BMP Results ABG Results   Recent Labs     01/20/22  1726 01/21/22  0607   SODIUM 142 141   POTASSIUM 4.5 4.3   CHLORIDE 107 111*   CALCIUM 8.1* 7.3*   TOTALPROTEIN 6.1*  --    ALBUMIN 2.6*  --    CO2 28 25   BUN 23* 19   CREATININE 0.89 0.66   GLUCOSENF 77 132*    Recent Labs     01/20/22  1726 01/20/22  1749 01/20/22  2245 01/21/22  0607   PH  --  7.31* 7.26*  --    PCO2  --  58* 54*  --    PO2  --  68* 65*  --    BICARBONATE  --  28.9 24.4  --    BASEEXCESS  --  1.2 -3.4  --    POTASSIUM 4.5  --   --  4.3        Liver Test Results  Cardiac Results   Recent Labs     01/20/22  1726   TOTBILIRUBIN 0.9   AST 27   ALT 15   ALKPHOS 88    Recent Labs     01/20/22  1726   TROPONINI 23*        Pregnancy Test Results  Assessment/Plan:  1. Acute and chronic hypoxemic resp failure  -On 3 L NC at home, on 3 L now, sat 96%    2. LUL/LLL PNA on CXR  -RPP negative  -Continue Doxy, d/c Ceftriaxone; will U/S L chest    3. COPD exacerbation  -Guaifenesin, Prednisone 40 mg daily, Duoneb q 4h    4. Tobacco use  -Smoking 1 ppd  -Discussed with her how bad smoking is for her and how she needs to quit to reduce the risk    5. Chronic pain  -Resume home Suboxone    6. ADHD  -Has been on Vyvanse at home for many yrs    Lovenox    DNR/DNI      Farrell Ours, MD

## 2022-01-21 NOTE — Care Plan (Signed)
Problem: Adult Inpatient Plan of Care  Goal: Plan of Care Review  Outcome: Ongoing (see interventions/notes)     Problem: Adult Inpatient Plan of Care  Goal: Patient-Specific Goal (Individualized)  Outcome: Ongoing (see interventions/notes)     Problem: Adult Inpatient Plan of Care  Goal: Absence of Hospital-Acquired Illness or Injury  Outcome: Ongoing (see interventions/notes)     Problem: Adult Inpatient Plan of Care  Goal: Optimal Comfort and Wellbeing  Outcome: Ongoing (see interventions/notes)     Problem: Adult Inpatient Plan of Care  Goal: Rounds/Family Conference  Outcome: Ongoing (see interventions/notes)     Problem: Fall Injury Risk  Goal: Absence of Fall and Fall-Related Injury  Outcome: Ongoing (see interventions/notes)

## 2022-01-21 NOTE — ED Nurses Note (Signed)
[facility] here to transport patient to [facility]  Report called to [facility] RN in CCU.  VSS.  IV x2 in tact.  NS infusing.

## 2022-01-21 NOTE — Ancillary Notes (Incomplete)
Patient being transferred to [facility] at this time. Called [facility] Respiratory dept. @0100 . Gave report on this BiPAP patient to judy? To let her know EMS was preparing her for transport.

## 2022-01-21 NOTE — Ancillary Notes (Signed)
Medical Nutrition Therapy Assessment        SUBJECTIVE : Patient's usual appetite and intake are good.  She ate less in the past few days due to not feeling well.  She denies difficulty chewing or swallowing.  Patient is familiar with the recommendation for a cardiac diet due to her recent MI.  She denies nausea but states that she occasionally "spits up bile".  She has reflux but avoids no related foods.  Patient denies a recent change in weight.  Patient states that she did not eat breakfast because she did not like the food.    OBJECTIVE:   24 YOF, 5'2", 153 lbs, BMI 28.04  Dx: Sepsis  PMH includes: COPD, drug abuse, CHF, HTN, MI, cholecystectomy  Sign meds: IV fluids, Prednisone  Hgb 13.6, Na 141, Gluc 132, GFR 100, Alb 2.6    Current Diet Order/Nutrition Support: Cardiac    Plan/Interventions : Encourage diet compliance.  Dietary staff to visit for meal preferences.  Patient was provided a menu.  Goal:  Meal intake >50%.    Nutrition Diagnosis: Inadequate energy intake related to Current medical condition as evidenced by Decreased oral intake     Nolon Nations, RDLD  01/21/2022, 15:10

## 2022-01-21 NOTE — Transitional Care (Signed)
61 yo WF with COPD on 3 L home O2 who was found unresponsive at home by EMS.  She was hypoxic and requiring up to 10 L on presentation to [facility] the ED. ABG 7.31/58/68.  Chest x-ray showed extensive left upper lobe and left lower lobe infiltrates.  Respiratory pathogens panel was negative.  She was placed on BiPAP, started on doxycycline and ceftriaxone and steroids and admitted to the ICU.  She has been weaned off BiPAP and is currently satting 96% on 3 L nasal cannula.  Continuing with steroids, doxycycline and bronchodilators.  Smoking cessation strongly advised to her.  She has been restarted on her home Suboxone.  Will transfer to the floor.       Accepting Physician: Dr. Hortense Ramal

## 2022-01-22 LAB — BASIC METABOLIC PANEL
ANION GAP: 4 mmol/L — ABNORMAL LOW (ref 10–20)
BUN/CREA RATIO: 33 — ABNORMAL HIGH (ref 6–22)
BUN: 18 mg/dL (ref 7–25)
CALCIUM: 8.2 mg/dL — ABNORMAL LOW (ref 8.6–10.3)
CHLORIDE: 109 mmol/L — ABNORMAL HIGH (ref 98–107)
CO2 TOTAL: 28 mmol/L (ref 21–31)
CREATININE: 0.54 mg/dL — ABNORMAL LOW (ref 0.60–1.30)
ESTIMATED GFR: 105 mL/min/{1.73_m2} (ref >59)
GLUCOSE: 106 mg/dL (ref 74–109)
OSMOLALITY, CALCULATED: 284 mOsm/kg (ref 270–290)
POTASSIUM: 4.1 mmol/L (ref 3.5–5.1)
SODIUM: 141 mmol/L (ref 136–145)

## 2022-01-22 LAB — CHLAMYDIA TRACHOMATIS/NEISSERIA GONORRHOEAE RNA, NAAT
CHLAMYDIA TRACHOMATIS RNA: NEGATIVE
NEISSERIA GONORRHEA GC RNA: NEGATIVE

## 2022-01-22 MED ORDER — DOCUSATE SODIUM 50 MG/5 ML ORAL LIQUID
100.0000 mg | Freq: Two times a day (BID) | ORAL | Status: DC
Start: 2022-01-22 — End: 2022-01-23
  Administered 2022-01-22 – 2022-01-23 (×2): 0 mg via GASTROSTOMY
  Filled 2022-01-22 (×4): qty 10

## 2022-01-22 MED ORDER — LACTULOSE 10 GRAM/15 ML (15 ML) ORAL SOLUTION
30.0000 mL | Freq: Every day | ORAL | Status: DC
Start: 2022-01-22 — End: 2022-01-22
  Administered 2022-01-22: 0 mL via ORAL
  Filled 2022-01-22: qty 30

## 2022-01-22 NOTE — Progress Notes (Signed)
[facility]    HOSPITALIST PROGRESS NOTE    Brianna Mcgrath  Date of service: 01/22/2022  Date of Admission:  01/20/2022  Hospital Day:  LOS: 2 days     Subjective:   Patient seen and examined. She reports feeling better today and has no new complaints. She is saturating at 92 percent on 3 liter O2 via nasal cannula. She uses 3 liters at home. Her heart rate is in 80s. She is tolerating some oral intake..  Denies any pain. The patient denies chest pain, shortness of breath, abdominal pain, or nausea/vomiting.      Vital Signs:  Filed Vitals:    01/22/22 0807 01/22/22 0900 01/22/22 1028 01/22/22 1144   BP: 111/65   (!) 106/53   Pulse: 81  75    Resp: 14      Temp: 36.8 C (98.2 F)      SpO2: 95% 95%          Physical Exam:  General:  Patient in NAD, resting in bed, no visitors present  Head:  Normocephalic, atraumatic  Eyes:  PERRL, anicteric sclera  ENT:  Oral mucosa moist, no nasal discharge   Neck:  Soft, supple, trachea midline  Heart:  RRR, S1 and S2 normal  Lungs:  Unlabored respirations.  Lungs are clear to auscultation bilaterally, with no wheezes, no rales, no conversational dyspnea  Abdomen:  Soft, active bowel sounds, non-tender to palpation, non-distended  Extremities:  Pulses equal bilaterally.  Capillary refill less than 3 seconds.  No edema in lower extremities bilaterally   Skin:  Warm and dry, not diaphoretic.  No ecchymosis noted.   Neuro:  A&O x 3.  No focal deficits.  Speech intact  Psych:  Cooperative, not agitated    Intake & Output:    Intake/Output Summary (Last 24 hours) at 01/22/2022 1231  Last data filed at 01/22/2022 0500  Gross per 24 hour   Intake 295 ml   Output 700 ml   Net -405 ml     I/O current shift:  No intake/output data recorded.  Emesis:    BM:    Date of Last Bowel Movement: 01/16/22  Heme:      acetaminophen (TYLENOL) tablet, 650 mg, Oral, Q6H PRN  arformoterol (BROVANA) 15 mcg/2 mL nebulizer solution, 15 mcg, Nebulization,  2x/day  buprenorphine-naloxone (SUBOXONE) 2-0.5mg  per sublingual tablet, 1 Tablet, Sublingual, Daily  doxycycline tablet, 100 mg, Oral, 2x/day  enoxaparin PF (LOVENOX) 40 mg/0.4 mL SubQ injection, 40 mg, Subcutaneous, Q24H  guaiFENesin 100mg  per 53mL oral liquid - for cough (expectorant), 200 mg, Oral, Q6H PRN  ipratropium-albuterol 0.5 mg-3 mg(2.5 mg base)/3 mL Solution for Nebulization, 3 mL, Nebulization, Q4H PRN  nicotine (NICODERM CQ) transdermal patch (mg/24 hr), 21 mg, Transdermal, Daily  ondansetron (ZOFRAN) 2 mg/mL injection, 4 mg, Intravenous, Q8H PRN  pneumococcal 23-valent polysaccharide vaccine (PNEUMOVAX 23) IM injection, 0.5 mL, IntraMUSCULAR, Once PRN  predniSONE (DELTASONE) tablet, 40 mg, Oral, Daily with Breakfast        Labs:  Recent Results (from the past 48 hour(s))   CBC WITH DIFF    Collection Time: 01/21/22  6:07 AM   Result Value    WBC 12.0 (H)    HGB 13.6    HCT 41.9    PLATELETS 104 (L)      Results for orders placed or performed during the hospital encounter of 01/20/22 (from the past 48 hour(s))   [place] PANEL    Collection  Time: 01/22/22  3:32 AM   Result Value    SODIUM 141    POTASSIUM 4.1    CHLORIDE 109 (H)    CO2 TOTAL 28    GLUCOSE 106    BUN 18    CREATININE 0.54 (L)      Recent Results (from the past 48 hour(s))   COMPREHENSIVE METABOLIC PANEL, NON-FASTING    Collection Time: 01/20/22  5:26 PM   Result Value    ALKALINE PHOSPHATASE 88    ALT (SGPT) 15    AST (SGOT) 27      Results for orders placed or performed during the hospital encounter of 01/20/22 (from the past 48 hour(s))   TROPONIN-I    Collection Time: 01/20/22  5:26 PM   Result Value    TROPONIN I 23 (H)      Recent Results (from the past 48 hour(s))   PT/INR    Collection Time: 01/20/22  5:27 PM   Result Value    PROTHROMBIN TIME 13.6 (H)    INR 1.28 (H)      No results found for this or any previous visit (from the past 1344 hour(s)).   Results for orders placed or performed during the hospital encounter of  01/20/22 (from the past 48 hour(s))   ARTERIAL BLOOD GAS/LACTATE    Collection Time: 01/20/22 10:45 PM   Result Value    LACTATE 0.8   ARTERIAL BLOOD GAS/LACTATE    Collection Time: 01/20/22  5:49 PM   Result Value    LACTATE 0.7        Microbiology:  Hospital Encounter on 01/20/22 (from the past 96 hour(s))   ADULT ROUTINE BLOOD CULTURE, SET OF 2 ADULT BOTTLES (BACTERIA AND YEAST)    Collection Time: 01/20/22  5:57 PM    Specimen: Blood   Culture Result Status    BLOOD CULTURE, ROUTINE No Growth 18-24 hrs. Preliminary   ADULT ROUTINE BLOOD CULTURE, SET OF 2 ADULT BOTTLES (BACTERIA AND YEAST)    Collection Time: 01/20/22  6:03 PM    Specimen: Blood   Culture Result Status    BLOOD CULTURE, ROUTINE No Growth 18-24 hrs. Preliminary   RESPIRATORY CULTURE AND GRAM STAIN, AEROBIC - SPUTUM    Collection Time: 01/21/22  2:08 AM    Specimen: Sputum; Other    Narrative    The following orders were created for panel order RESPIRATORY CULTURE AND GRAM STAIN, AEROBIC - SPUTUM.  Procedure                               Abnormality         Status                     ---------                               -----------         ------                     SPUTUM [id number]  Please view results for these tests on the individual orders.       Imaging:   ECG 12 LEAD  Sinus tachycardia  Nonspecific ST and T wave abnormality  Otherwise normal ECG  No previous ECGs available  Confirmed by Rana, Javed ([id number]) on 01/21/2022 9:22:40 PM        Assessment/ Plan:   Active Hospital Problems   (*Primary Problem)    Diagnosis   . *Sepsis (CMS [place])   . Substance abuse (CMS [place])   . Pneumonia   . Acute on chronic respiratory failure with hypoxia and hypercapnia (CMS HCC)     Overall patient appears to be slowly improving. Micro negative till date. Encourage oral intake. Ambulate with assistance. If patient continues to improve probable discharge home tomorrow. Condition and plan was  discussed with patient and all questions answered.     See orders.          Disposition Planning:  Home    Dionne Milo, MD  01/22/2022  [facility] HOSPITALIST

## 2022-01-22 NOTE — Care Plan (Signed)
Problem: Adult Inpatient Plan of Care  Goal: Plan of Care Review  Outcome: Ongoing (see interventions/notes)  Goal: Patient-Specific Goal (Individualized)  Outcome: Ongoing (see interventions/notes)  Flowsheets (Taken 01/22/2022 0900)  Individualized Care Needs: maintain 02 saturation  Anxieties, Fears or Concerns: anxiety when short of breath  Goal: Absence of Hospital-Acquired Illness or Injury  Outcome: Ongoing (see interventions/notes)  Intervention: Identify and Manage Fall Risk  Recent Flowsheet Documentation  Taken 01/22/2022 0900 by Pura Spice, RN  Safety Promotion/Fall Prevention: fall prevention program maintained  Intervention: Prevent Skin Injury  Recent Flowsheet Documentation  Taken 01/22/2022 1405 by Pura Spice, RN  Body Position: supine, head elevated  Taken 01/22/2022 1230 by Pura Spice, RN  Body Position: sitting  Taken 01/22/2022 1000 by Pura Spice, RN  Body Position: supine  Taken 01/22/2022 0900 by Pura Spice, RN  Body Position: supine  Skin Protection: adhesive use limited  Intervention: Prevent Infection  Recent Flowsheet Documentation  Taken 01/22/2022 0900 by Pura Spice, RN  Infection Prevention: promote handwashing  Goal: Optimal Comfort and Wellbeing  Outcome: Ongoing (see interventions/notes)  Goal: Rounds/Family Conference  Outcome: Ongoing (see interventions/notes)     Problem: Fall Injury Risk  Goal: Absence of Fall and Fall-Related Injury  Outcome: Ongoing (see interventions/notes)  Intervention: Identify and Manage Contributors  Recent Flowsheet Documentation  Taken 01/22/2022 1405 by Pura Spice, RN  Self-Care Promotion: independence encouraged  Taken 01/22/2022 1230 by Pura Spice, RN  Self-Care Promotion: independence encouraged  Intervention: Promote Injury-Free Environment  Recent Flowsheet Documentation  Taken 01/22/2022 0900 by Pura Spice, RN  Safety Promotion/Fall Prevention: fall prevention program maintained

## 2022-01-23 MED ORDER — PREDNISONE 20 MG TABLET
40.0000 mg | ORAL_TABLET | Freq: Every day | ORAL | 0 refills | Status: AC
Start: 2022-01-23 — End: 2022-01-28

## 2022-01-23 MED ORDER — QUETIAPINE 25 MG TABLET
25.0000 mg | ORAL_TABLET | Freq: Every evening | ORAL | Status: DC
Start: 2022-01-23 — End: 2022-01-23
  Administered 2022-01-23: 25 mg via ORAL
  Filled 2022-01-23: qty 1

## 2022-01-23 MED ORDER — BUPRENORPHINE 8 MG-NALOXONE 2 MG SUBLINGUAL TABLET
1.0000 | SUBLINGUAL_TABLET | Freq: Two times a day (BID) | SUBLINGUAL | Status: DC
Start: 2022-01-23 — End: 2022-01-23
  Administered 2022-01-23: 1 via SUBLINGUAL
  Filled 2022-01-23: qty 1

## 2022-01-23 MED ORDER — BUPRENORPHINE 8 MG-NALOXONE 2 MG SUBLINGUAL TABLET
1.0000 | SUBLINGUAL_TABLET | Freq: Three times a day (TID) | SUBLINGUAL | Status: DC
Start: 2022-01-23 — End: 2022-01-23

## 2022-01-23 MED ORDER — DOXYCYCLINE HYCLATE 100 MG TABLET
100.0000 mg | ORAL_TABLET | Freq: Two times a day (BID) | ORAL | 0 refills | Status: AC
Start: 2022-01-23 — End: 2022-01-28

## 2022-01-23 NOTE — Nurses Notes (Signed)
Discharge instructions given to pt, verbalized understanding. Telemetry removed. Pt DC to home with husband. Pt left floor in wheelchair and nurse.

## 2022-01-23 NOTE — Nurses Notes (Signed)
IV site removed, catheter intact, pressure dressing applied.

## 2022-01-23 NOTE — Discharge Summary (Signed)
[facility]  DISCHARGE SUMMARY    PATIENT NAME:  Brianna Mcgrath, Brianna Mcgrath  MRN:  [id number]  DOB:  [birth year]    ENCOUNTER DATE:  01/20/2022  INPATIENT ADMISSION DATE: 01/20/2022  DISCHARGE DATE:  01/23/2022    ATTENDING PHYSICIAN: Louis Meckel, MD  SERVICE: PRN HOSPITALIST  PRIMARY CARE PHYSICIAN: No Pcp       No lay caregiver identified.      PRIMARY DISCHARGE DIAGNOSIS: Sepsis (CMS [facility])  Active Hospital Problems    Diagnosis Date Noted   . Substance abuse (CMS HCC) [F19.10] 01/21/2022   . Pneumonia [J18.9] 01/20/2022   . Acute on chronic respiratory failure with hypoxia and hypercapnia (CMS HCC) [O87.86, J96.22] 01/20/2022      Resolved Hospital Problems    Diagnosis    . Principal Problem: Sepsis (CMS HCC) [A41.9]      There are no active non-hospital problems to display for this patient.          Current Discharge Medication List      START taking these medications.      Details   doxycycline 100 mg Tablet   100 mg, Oral, 2 TIMES DAILY  Qty: 10 Tablet  Refills: 0     predniSONE 20 mg Tablet  Commonly known as: DELTASONE   40 mg, Oral, DAILY  Qty: 10 Tablet  Refills: 0        CONTINUE these medications - NO CHANGES were made during your visit.      Details   albuterol sulfate 90 mcg/Actuation HFA Aerosol Inhaler oral inhaler   1-2 Puffs, Inhalation, EVERY 6 HOURS PRN  Refills: 0     Amlodipine-Benazepril 10-40 mg Capsule   1 Capsule, Oral, 2 TIMES DAILY  Refills: 0     atorvastatin 40 mg Tablet  Commonly known as: LIPITOR   40 mg, Oral, EVERY EVENING  Refills: 0     budesonide-formoteroL 160-4.5 mcg/actuation oral inhaler  Commonly known as: SYMBICORT   2 Puffs, Inhalation, 2 TIMES DAILY  Refills: 0     buprenorphine-naloxone 8-2 mg Tablet, Sublingual  Commonly known as: SUBOXONE   1 Tablet, Sublingual, 3 TIMES DAILY PRN  Refills: 0     clopidogreL 75 mg Tablet  Commonly known as: PLAVIX   75 mg, Oral, DAILY  Refills: 0     lisdexamfetamine 70 mg Capsule  Commonly known as: VYVANSE   70 mg, Oral,  DAILY  Refills: 0     pregabalin 200 mg Capsule  Commonly known as: LYRICA   200 mg, Oral, ONCE  Refills: 0     QUEtiapine 25 mg Tablet  Commonly known as: SEROQUEL   25 mg, Oral, NIGHTLY  Refills: 0          Discharge med list refreshed?  YES     No Known Allergies  HOSPITAL PROCEDURE(S):   No orders of the defined types were placed in this encounter.      REASON FOR HOSPITALIZATION AND HOSPITAL COURSE   BRIEF HPI:  This is a 61 y.o., female admitted for sepsis, PNA  BRIEF HOSPITAL NARRATIVE:     Brianna Mcgrath is a 61 y.o., White female who initially presents to the emergency department yesterday on January 20, 2022 with chief complaint of having been found unresponsive, hypoxemic cough.  Patient case was discussed with the ED physician at [facility] emergency department, patient remained directly accept the patient for admission.  The patient was seen examined at bedside shortly following her arrival from  [place] the ED.  Patient is unsure what had occurred.  Patient tells me she does use supplemental oxygen at home at 3 L at all times, she does tell me that she has a smoker.  She says she does have COPD.  Report of the patient, the patient was brought to the ED by her family, it found her unresponsive.  Patient denies any focal weakness or tingling numbness.  She is unsure when care.  Per report from the ED physician, the patient was quite hypoxic at presentation to the ED, she was requiring 10 L of supplemental oxygen at 1 point patient is also having borderline hypotension with the ED as well despite having received fluid resuscitation. Patient did meet criteria for sepsis at [facility], source this time is pneumonia.  Patient did have a urinalysis as well as urine drug screen, this was positive for amphetamine, the assisting were pharmacy control substance monitoring program search not reveal stimulants at work also flag for this, question whether the patient's  unresponsive  was multifactorial related to her hypoxemia as well as substance abuse.  Patient which she her pneumonia with ceftriaxone  2g IV every 24 hours as well as doxycycline 100 mg IV every 12 hours.  She will monitor on continuous pulse oximetry, currently using BiPAP and tolerating this well.  Patient's blood pressure was initially borderline and [place] ED, currently her map is 70 at bedside during the encounter.  Patient started on prednisone 40 mg p.o. Q 24 H 10 daily for 5 days.  Patient was admitted for COPD exacerbation with glucocorticoids as mentioned previously, she also received DuoNeb 3 mL HHN every 4 hours.  She will have morning laboratory studies this morning as well.  Patient had a nutrition therapy assessment, history of reflux but avoids no related FH.  No change of weight.  After admission patient was at her baseline oxygen level of 3 L and satting 92%.  Was tolerating oral intake.  Heart rate normalized to 80s.  Social worker evaluated the MA supplies.  Patient gets oxygen through [facility] and she has a portable tank that will be ordered for her.  Patient seen examined today.  She is much improved except an occasional dry cough.  Patient is back to baseline and would like to go home.  Her antibiotics were changed to oral doxycycline yesterday which she has had for 2 and half days.  She will be discharged home today with a follow-up referral to [facility] in [place] the [place].  Patient has no local PCP will need to get established.  Recommend her being seen within 1 week.  She will have 5 more days of doxycycline 100 mg b.i.d. for her pneumonia.  We will also give her 40 mg of prednisone for the next 5 days.  We recommend patient stop smoking. Questions were answered to patient's satisfaction.  See EMR for further documentation, testing, and treatment.    CONDITION ON DISCHARGE:  A. Ambulation: Full ambulation  B. Self-care Ability: Complete  C. Cognitive Status Oriented x 3  D.  Code status at discharge:  No CPR      LINES/DRAINS/WOUNDS AT DISCHARGE:   Patient Lines/Drains/Airways Status     Active Line / Dialysis Catheter / Dialysis Graft / Drain / Airway / Wound     Name Placement date Placement time Site Days    Peripheral IV Right;Posterior Forearm 01/20/22  --  -- 3    Peripheral IV  Distal;Right Basilic  (medial side of arm) --  --  -- --                DISCHARGE DISPOSITION:  Home discharge and Home Oxygen  DISCHARGE INSTRUCTIONS:  Post-Discharge Follow Up Appointments     Follow up with Internal Medicine, [facility]    Phone: [phone]    Where: [address] [place] [address]           DISCHARGE INSTRUCTION - DIET     Diet: RESUME HOME DIET      DISCHARGE INSTRUCTION - ACTIVITY     Activity: AS TOLERATED      FOLLOW-UP: INTERNAL MEDICINE ([facility]) - [facility] [place] [place]     Follow-up in: 1 WEEK    Reason for visit: HOSPITAL DISCHARGE    Follow-up reason: post hospitalization- will need to get established as a new patient           Judene Companion, FNP-BC    Copies sent to Care Team       Relationship Specialty Notifications Start End    Pcp, No PCP - General   01/20/22           Referring providers can utilize [email or web address] to access their referred [facility] patient's information.          I personally saw and evaluated the patient as part of a shared service with an APP.    My substantive findings are:  Patient seen and examined, Lungs CTA  Plan discussed with patient and family and all questions answered.  I independently of the APP spent a total of (30) minutes in direct/indirect care of this patient including initial evaluation, review of laboratory, radiology, diagnostic studies, review of medical record, order entry and coordination of care.    Louis Meckel, MD

## 2022-01-23 NOTE — Care Management Notes (Signed)
Patient has O2 through [facility]. Patient states she needs a portable tank to get home. CM contacted Duwayne Heck, who stated she would bring a portable up to patient. Patient states no other needs at this time. CM following.

## 2022-01-24 ENCOUNTER — Telehealth (HOSPITAL_COMMUNITY): Payer: Self-pay

## 2022-01-25 ENCOUNTER — Emergency Department (HOSPITAL_BASED_OUTPATIENT_CLINIC_OR_DEPARTMENT_OTHER): Payer: Medicare Other

## 2022-01-25 ENCOUNTER — Emergency Department (EMERGENCY_DEPARTMENT_HOSPITAL): Payer: Medicare Other

## 2022-01-25 ENCOUNTER — Other Ambulatory Visit: Payer: Self-pay

## 2022-01-25 ENCOUNTER — Emergency Department
Admission: EM | Admit: 2022-01-25 | Discharge: 2022-01-26 | Disposition: A | Discharge status: Other Acute Inpatient Hospital | Payer: Medicare Other | Attending: Emergency Medicine | Admitting: Emergency Medicine

## 2022-01-25 DIAGNOSIS — R4182 Altered mental status, unspecified: Secondary | ICD-10-CM | POA: Insufficient documentation

## 2022-01-25 DIAGNOSIS — I214 Non-ST elevation (NSTEMI) myocardial infarction: Secondary | ICD-10-CM | POA: Insufficient documentation

## 2022-01-25 DIAGNOSIS — J440 Chronic obstructive pulmonary disease with acute lower respiratory infection: Secondary | ICD-10-CM | POA: Insufficient documentation

## 2022-01-25 DIAGNOSIS — R2990 Unspecified symptoms and signs involving the nervous system: Secondary | ICD-10-CM

## 2022-01-25 DIAGNOSIS — J9811 Atelectasis: Secondary | ICD-10-CM | POA: Insufficient documentation

## 2022-01-25 DIAGNOSIS — J9600 Acute respiratory failure, unspecified whether with hypoxia or hypercapnia: Secondary | ICD-10-CM | POA: Insufficient documentation

## 2022-01-25 DIAGNOSIS — R0602 Shortness of breath: Secondary | ICD-10-CM

## 2022-01-25 DIAGNOSIS — J90 Pleural effusion, not elsewhere classified: Secondary | ICD-10-CM | POA: Insufficient documentation

## 2022-01-25 DIAGNOSIS — J189 Pneumonia, unspecified organism: Secondary | ICD-10-CM | POA: Insufficient documentation

## 2022-01-25 DIAGNOSIS — Z9981 Dependence on supplemental oxygen: Secondary | ICD-10-CM | POA: Insufficient documentation

## 2022-01-25 DIAGNOSIS — F1911 Other psychoactive substance abuse, in remission: Secondary | ICD-10-CM | POA: Insufficient documentation

## 2022-01-25 LAB — COMPREHENSIVE METABOLIC PANEL, NON-FASTING
ALBUMIN/GLOBULIN RATIO: 0.7 — ABNORMAL LOW (ref 0.8–1.4)
ALBUMIN: 2.9 g/dL — ABNORMAL LOW (ref 3.4–5.0)
ALKALINE PHOSPHATASE: 95 U/L (ref 46–116)
ALT (SGPT): 17 U/L (ref <=78)
ANION GAP: 7 mmol/L — ABNORMAL LOW (ref 10–20)
AST (SGOT): 19 U/L (ref 15–37)
BILIRUBIN TOTAL: 0.9 mg/dL (ref 0.2–1.0)
BUN/CREA RATIO: 16
BUN: 13 mg/dL (ref 7–18)
CALCIUM, CORRECTED: 10 mg/dL
CALCIUM: 8.9 mg/dL (ref 8.5–10.1)
CHLORIDE: 101 mmol/L (ref 98–107)
CO2 TOTAL: 33 mmol/L — ABNORMAL HIGH (ref 21–32)
CREATININE: 0.8 mg/dL (ref 0.55–1.02)
ESTIMATED GFR: 84 mL/min/{1.73_m2} (ref >59)
GLOBULIN: 4
GLUCOSE: 221 mg/dL — ABNORMAL HIGH (ref 74–106)
OSMOLALITY, CALCULATED: 288 mOsm/kg (ref 270–290)
POTASSIUM: 3.9 mmol/L (ref 3.5–5.1)
PROTEIN TOTAL: 6.9 g/dL (ref 6.4–8.2)
SODIUM: 141 mmol/L (ref 136–145)

## 2022-01-25 LAB — URINALYSIS, MACRO/MICRO
BILIRUBIN: NEGATIVE mg/dL
BLOOD: NEGATIVE mg/dL
GLUCOSE: 100 mg/dL — AB
KETONES: NEGATIVE mg/dL
LEUKOCYTES: NEGATIVE WBCs/uL
NITRITE: NEGATIVE
PH: 6 (ref 4.6–8.0)
PROTEIN: 30 mg/dL — AB
SPECIFIC GRAVITY: 1.02 (ref 1.003–1.035)
UROBILINOGEN: 0.2 mg/dL (ref 0.2–1.0)

## 2022-01-25 LAB — COVID-19, FLU A/B, RSV RAPID BY PCR
INFLUENZA VIRUS TYPE A: NOT DETECTED
INFLUENZA VIRUS TYPE B: NOT DETECTED
RESPIRATORY SYNCTIAL VIRUS (RSV): NOT DETECTED
SARS-CoV-2: NOT DETECTED

## 2022-01-25 LAB — CBC WITH DIFF
BASOPHIL #: 0.02 10*3/uL (ref 0.00–2.50)
BASOPHIL %: 0 % (ref 0–3)
EOSINOPHIL #: 0.08 10*3/uL (ref 0.00–2.40)
EOSINOPHIL %: 1 % (ref 0–7)
HCT: 48.6 % — ABNORMAL HIGH (ref 37.0–47.0)
HGB: 16.4 g/dL — ABNORMAL HIGH (ref 12.5–16.0)
LYMPHOCYTE #: 0.4 10*3/uL — ABNORMAL LOW (ref 2.10–11.00)
LYMPHOCYTE %: 4 % — ABNORMAL LOW (ref 25–45)
MCH: 32.2 pg — ABNORMAL HIGH (ref 27.0–32.0)
MCHC: 33.7 g/dL (ref 32.0–36.0)
MCV: 95.2 fL (ref 78.0–99.0)
MONOCYTE #: 0.15 10*3/uL (ref 0.00–4.10)
MONOCYTE %: 1 % (ref 0–12)
MPV: 10.3 fL (ref 7.4–10.4)
NEUTROPHIL #: 10.61 10*3/uL (ref 4.10–29.00)
NEUTROPHIL %: 94 % — ABNORMAL HIGH (ref 40–76)
PLATELETS: 153 10*3/uL (ref 140–440)
RBC: 5.1 10*6/uL (ref 4.20–5.40)
RDW: 16.2 % — ABNORMAL HIGH (ref 11.6–14.8)
WBC: 11.3 10*3/uL — ABNORMAL HIGH (ref 4.0–10.5)

## 2022-01-25 LAB — URINE DRUG SCREEN
AMPHETAMINES URINE: NEGATIVE
BARBITURATES URINE: NEGATIVE
BENZODIAZEPINES URINE: NEGATIVE
CANNABINOIDS URINE: NEGATIVE
COCAINE METABOLITES URINE: NEGATIVE
METHADONE URINE: NEGATIVE
OPIATES URINE: NEGATIVE
PCP URINE: NEGATIVE

## 2022-01-25 LAB — PT/INR
INR: 1.23 — ABNORMAL HIGH (ref 0.88–1.10)
PROTHROMBIN TIME: 13.1 seconds — ABNORMAL HIGH (ref 9.2–12.1)

## 2022-01-25 LAB — ADULT ROUTINE BLOOD CULTURE, SET OF 2 BOTTLES (BACTERIA AND YEAST)
BLOOD CULTURE, ROUTINE: NO GROWTH
BLOOD CULTURE, ROUTINE: NO GROWTH

## 2022-01-25 LAB — URINALYSIS, MICROSCOPIC

## 2022-01-25 LAB — TROPONIN-I
TROPONIN I: 69 ng/L — ABNORMAL HIGH (ref <15)
TROPONIN I: 164 ng/L — ABNORMAL HIGH (ref <15)

## 2022-01-25 LAB — PTT (PARTIAL THROMBOPLASTIN TIME): APTT: 31.2 seconds (ref 22.0–31.7)

## 2022-01-25 LAB — SALICYLATE ACID LEVEL: SALICYLATE LEVEL: 2 mg/dL — ABNORMAL LOW (ref 3–20)

## 2022-01-25 LAB — NT-PROBNP: NT-PROBNP: 7454 pg/mL — ABNORMAL HIGH (ref <=125)

## 2022-01-25 LAB — AMMONIA: AMMONIA: 11 umol/L (ref 10–54)

## 2022-01-25 LAB — LACTIC ACID LEVEL W/ REFLEX FOR LEVEL >2.0: LACTIC ACID: 0.9 mmol/L (ref 0.4–2.0)

## 2022-01-25 MED ORDER — FENTANYL (PF) 50 MCG/ML INJECTION WRAPPER
INJECTION | INTRAMUSCULAR | Status: AC
Start: 2022-01-25 — End: 2022-01-25
  Filled 2022-01-25: qty 20

## 2022-01-25 MED ORDER — SODIUM CHLORIDE 0.9 % INTRAVENOUS SOLUTION
1.0000 ug/min | INTRAVENOUS | Status: DC
Start: 2022-01-26 — End: 2022-01-26
  Administered 2022-01-26: 4 ug/min via INTRAVENOUS
  Administered 2022-01-26: 2 ug/min via INTRAVENOUS
  Administered 2022-01-26: 6 ug/min via INTRAVENOUS
  Administered 2022-01-26: 18 ug/min via INTRAVENOUS
  Administered 2022-01-26: 1 ug/min via INTRAVENOUS
  Administered 2022-01-26: 12 ug/min via INTRAVENOUS
  Administered 2022-01-26: 4 ug/min via INTRAVENOUS
  Filled 2022-01-25: qty 8

## 2022-01-25 MED ORDER — NOREPINEPHRINE BITARTRATE 1 MG/ML INTRAVENOUS SOLUTION
INTRAVENOUS | Status: AC
Start: 2022-01-25 — End: 2022-01-25
  Filled 2022-01-25: qty 8

## 2022-01-25 MED ORDER — LORAZEPAM 2 MG/ML INJECTION SYRINGE
INJECTION | INTRAMUSCULAR | Status: AC
Start: 2022-01-25 — End: 2022-01-25
  Filled 2022-01-25: qty 1

## 2022-01-25 MED ORDER — MIDAZOLAM (PF) 1 MG/ML IN 0.9 % SODIUM CHLORIDE INTRAVENOUS SOLUTION
INTRAVENOUS | Status: AC
Start: 2022-01-25 — End: 2022-01-25
  Filled 2022-01-25: qty 100

## 2022-01-25 MED ORDER — KETAMINE 100 MG/ML INJECTION SOLUTION
INTRAMUSCULAR | Status: AC
Start: 2022-01-25 — End: 2022-01-25
  Filled 2022-01-25: qty 5

## 2022-01-25 MED ORDER — FENTANYL (PF) 50 MCG/ML INJECTION SOLUTION
50.0000 ug | INTRAMUSCULAR | Status: DC
Start: 2022-01-25 — End: 2022-01-26

## 2022-01-25 MED ORDER — KETAMINE 100 MG/ML INJECTION SOLUTION
100.0000 mg | INTRAMUSCULAR | Status: AC
Start: 2022-01-25 — End: 2022-01-25
  Administered 2022-01-25: 100 mg via INTRAVENOUS

## 2022-01-25 MED ORDER — KETAMINE 100 MG/ML INJECTION SOLUTION
50.0000 mg | INTRAMUSCULAR | Status: DC
Start: 2022-01-26 — End: 2022-01-25

## 2022-01-25 MED ORDER — MIDAZOLAM 5 MG/ML INJECTION WRAPPER
2.0000 mg | INTRAMUSCULAR | Status: DC
Start: 2022-01-25 — End: 2022-01-26
  Administered 2022-01-25 – 2022-01-26 (×2): 0 mg via INTRAVENOUS

## 2022-01-25 MED ORDER — SUCCINYLCHOLINE 20 MG/ML INTRAVENOUS WRAPPER
100.0000 mg | INJECTION | INTRAVENOUS | Status: AC
Start: 2022-01-25 — End: 2022-01-25
  Administered 2022-01-25: 100 mg via INTRAVENOUS
  Filled 2022-01-25: qty 10

## 2022-01-25 MED ORDER — PROPOFOL 10 MG/ML INTRAVENOUS EMULSION
INTRAVENOUS | Status: AC
Start: 2022-01-25 — End: 2022-01-25
  Filled 2022-01-25: qty 100

## 2022-01-25 MED ORDER — LORAZEPAM 2 MG/ML INJECTION WRAPPER
1.0000 mg | INTRAMUSCULAR | Status: AC
Start: 2022-01-25 — End: 2022-01-25
  Administered 2022-01-25: 1 mg via INTRAVENOUS

## 2022-01-25 MED ORDER — SODIUM CHLORIDE 0.9 % INTRAVENOUS SOLUTION
0.0500 mg/kg/h | INTRAVENOUS | Status: DC
Start: 2022-01-26 — End: 2022-01-26
  Administered 2022-01-25: 0 mg/kg/h via INTRAVENOUS
  Administered 2022-01-25: 0.05 mg/kg/h via INTRAVENOUS

## 2022-01-25 MED ORDER — IOHEXOL 350 MG IODINE/ML INTRAVENOUS SOLUTION
100.0000 mL | INTRAVENOUS | Status: DC
Start: 2022-01-25 — End: 2022-01-26

## 2022-01-25 MED ORDER — KETAMINE 100 MG/ML INJECTION SOLUTION
50.0000 mg | INTRAMUSCULAR | Status: DC
Start: 2022-01-26 — End: 2022-01-26
  Administered 2022-01-26: 0 mg via INTRAVENOUS

## 2022-01-25 NOTE — ED Nurses Note (Incomplete)
Ketamine 100 mg

## 2022-01-25 NOTE — Ancillary Notes (Signed)
PT. PLACED ON 6.5 L/M OXYMASK AT THIS TIME.Marland KitchenMarland KitchenMarland Kitchen

## 2022-01-25 NOTE — ED Provider Notes (Signed)
[facility] Emergency Department  ED Primary Provider Note  HPI:  Brianna Mcgrath is a 61 y.o. female     Patient presents with altered mental status unable to give history.  Per EMS there was concern possible stroke day asymmetric pupils and patient unable to move her left side.  She had a short 30 sec tonic-clonic seizure which was witnessed by EMS and terminated without intervention..  Glucose on scene normal.  Patient also given Narcan with some improvement of respiratory status.  Review of records indicates that the patient was recently discharged from [facility] 3/5 for acute on chronic hypoxic respiratory failure sepsis and pneumonia.  There she was treated with ceftriaxone and discharged with doxycycline which she is supposed to complete today.  Her baseline patient is on 3 L oxygen for COPD and has a known history of polysubstance abuse.  I did speak with patient's son who states he has power-of-attorney and asserts patient is full code    ROS review and negative aside from stated in HPI.    Physical Exam:  ED Triage Vitals [01/25/22 1929]   BP (Non-Invasive) (!) 143/65   Heart Rate (!) 114   Respiratory Rate (!) 30   Temperature 36.4 C (97.6 F)   SpO2 95 %   Weight 65.8 kg (145 lb)   Height 1.575 m (_0 )     Patient appears confused wide-eyed and actively hallucinating  saying "do see that do see that." SHe otherwise mumbles.  No dysarthria no aphasia.  She is moving all extremities.  Head is atraumatic no septal hematoma or hemotympanum.  Pupils are 4 mm equal sluggish.  Extraocular movements are intact.  Mucous membranes moist trachea midline neck is supple.  Heart has regular rate and rhythm without significant murmurs rubs or gallops her lungs are clear to auscultation.  Abdomen soft nontender nondistended.  Patient has a bruise over right lower abdomen.  Foley in place.  Extremities are warm well perfused cap refill brisk.    Patient  data:  Labs Ordered/Reviewed   COMPREHENSIVE METABOLIC PANEL, NON-FASTING - Abnormal; Notable for the following components:       Result Value    CO2 TOTAL 33 (*)     ANION GAP 7 (*)     ALBUMIN 2.9 (*)     GLUCOSE 221 (*)     ALBUMIN/GLOBULIN RATIO 0.7 (*)     All other components within normal limits    Narrative:     Estimated Glomerular Filtration Rate (eGFR) is calculated using the CKD-EPI (2021) equation, intended for patients 54 years of age and older. If gender is not documented or "unknown", there will be no eGFR calculation.   PT/INR - Abnormal; Notable for the following components:    PROTHROMBIN TIME 13.1 (*)     INR 1.23 (*)     All other components within normal limits   TROPONIN-I - Abnormal; Notable for the following components:    TROPONIN I 69 (*)     All other components within normal limits    Narrative:     Values received on females ranging between 12-15 ng/L MUST include the next serial troponin to review changes in the delta differences as the reference range for the Access II chemistry analyzer is lower than the established reference range.     CBC WITH DIFF - Abnormal; Notable for the following components:    WBC 11.3 (*)     HGB 16.4 (*)  HCT 48.6 (*)     MCH 32.2 (*)     RDW 16.2 (*)     NEUTROPHIL % 94 (*)     LYMPHOCYTE % 4 (*)     LYMPHOCYTE # 0.40 (*)     All other components within normal limits   URINALYSIS, MACRO/MICRO - Abnormal; Notable for the following components:    PROTEIN 30 (*)     GLUCOSE 100 (*)     All other components within normal limits   SALICYLATE ACID LEVEL - Abnormal; Notable for the following components:    SALICYLATE LEVEL 2 (*)     All other components within normal limits   URINALYSIS, MICROSCOPIC - Abnormal; Notable for the following components:    BACTERIA Rare (*)     MUCOUS Few (*)     All other components within normal limits   NT-PROBNP - Abnormal; Notable for the following components:    NT-PROBNP 7,454 (*)     All other components within normal  limits   TROPONIN-I - Abnormal; Notable for the following components:    TROPONIN I 164 (*)     All other components within normal limits    Narrative:     Values received on females ranging between 12-15 ng/L MUST include the next serial troponin to review changes in the delta differences as the reference range for the Access II chemistry analyzer is lower than the established reference range.     ARTERIAL BLOOD GAS/LACTATE - Abnormal; Notable for the following components:    PH (ARTERIAL) 7.34 (*)     PCO2 (ARTERIAL) 58 (*)     BICARBONATE (ARTERIAL) 30.7 (*)     BASE EXCESS (ARTERIAL) 3.1 (*)     CARBOXYHEMOGLOBIN 1.7 (*)     PO2 (ARTERIAL) 138 (*)     All other components within normal limits   TROPONIN-I - Abnormal; Notable for the following components:    TROPONIN I 642 (*)     All other components within normal limits    Narrative:     Values received on females ranging between 12-15 ng/L MUST include the next serial troponin to review changes in the delta differences as the reference range for the Access II chemistry analyzer is lower than the established reference range.     PTT (PARTIAL THROMBOPLASTIN TIME) - Normal   LACTIC ACID LEVEL W/ REFLEX FOR LEVEL >2.0 - Normal   URINE DRUG SCREEN - Normal   AMMONIA - Normal   COVID-19, FLU A/B, RSV RAPID BY PCR - Normal    Narrative:     Results are for the simultaneous qualitative identification of SARS-CoV-2 (formerly 2019-nCoV), Influenza A, Influenza B, and RSV RNA. These etiologic agents are generally detectable in nasopharyngeal and nasal swabs during the ACUTE PHASE of infection. Hence, this test is intended to be performed on respiratory specimens collected from individuals with signs and symptoms of upper respiratory tract infection who meet Centers for Disease Control and Prevention (CDC) clinical and/or epidemiological criteria for Coronavirus Disease 2019 (COVID-19) testing. CDC COVID-19 criteria for testing on human specimens is available at [facility]  webpage information for Healthcare Professionals: Coronavirus Disease 2019 (COVID-19) ([email or web address]).     False-negative results may occur if the virus has genomic mutations, insertions, deletions, or rearrangements or if performed very early in the course of illness. Otherwise, negative results indicate virus specific RNA targets are not detected, however negative results do not preclude SARS-CoV-2 infection/COVID-19, Influenza, or Respiratory syncytial virus infection. Results should  not be used as the sole basis for patient management decisions. Negative results must be combined with clinical observations, patient history, and epidemiological information. If upper respiratory tract infection is still suspected based on exposure history together with other clinical findings, re-testing should be considered.    Disclaimer:   This assay has been authorized by FDA under an Emergency Use Authorization for use in laboratories certified under the Clinical Laboratory Improvement Amendments of 1988 (CLIA), 42 U.S.C. [phone], to perform high complexity tests. The impacts of vaccines, antiviral therapeutics, antibiotics, chemotherapeutic or immunosuppressant drugs have not been evaluated.     Test methodology:   Cepheid Xpert Xpress SARS-CoV-2/Flu/RSV Assay real-time polymerase chain reaction (RT-PCR) test on the GeneXpert Dx and Xpert Xpress systems.   ADULT ROUTINE BLOOD CULTURE, SET OF 2 BOTTLES (BACTERIA AND YEAST)   ADULT ROUTINE BLOOD CULTURE, SET OF 2 BOTTLES (BACTERIA AND YEAST)   ADULT ROUTINE BLOOD CULTURE, SET OF 2 BOTTLES (BACTERIA AND YEAST)   ADULT ROUTINE BLOOD CULTURE, SET OF 2 BOTTLES (BACTERIA AND YEAST)   CBC/DIFF    Narrative:     The following orders were created for panel order CBC/DIFF.  Procedure                               Abnormality         Status                     ---------                               -----------         ------                      CBC WITH ZNBV[[id number]]                Abnormal            Final result                 Please view results for these tests on the individual orders.   URINALYSIS WITH REFLEX MICROSCOPIC AND CULTURE IF POSITIVE    Narrative:     The following orders were created for panel order URINALYSIS WITH REFLEX MICROSCOPIC AND CULTURE IF POSITIVE.  Procedure                               Abnormality         Status                     ---------                               -----------         ------                     URINALYSIS, MACRO/MICRO[[id number]]      Abnormal            Final result               URINALYSIS, MICROSCOPIC[[id number]]      Abnormal            Final result  Please view results for these tests on the individual orders.   PERFORM POC WHOLE BLOOD GLUCOSE     XR CHEST AP   Final Result by Raynelle Jan, RT (R)(CT) (03/11 0126)      XR AP MOBILE CHEST   Final Result by Raynelle Jan, RT (R)(CT) (03/11 0023)      CT ANGIO INTRA-EXTRA CRANIAL W/WO CONTRAST   Final Result by Raynelle Jan, RT (R)(CT) (03/10 2308)      XR AP MOBILE CHEST   Final Result by Lyndle Herrlich, RT (R) (03/10 2101)      CT BRAIN WO IV CONTRAST   Final Result by Raynelle Jan, RT (R)(CT) (03/10 2007)          Central Line    Date/Time: 01/26/2022 7:46 AM  Performed by: Eustace Quail, MD  Authorized by: Eustace Quail, MD     Consent:     Consent obtained:  Emergent situation  Pre-procedure details:     Indication(s): central venous access and needed after discharge for ongoing care      Hand hygiene: Hand hygiene performed prior to insertion      Sterile barrier technique: All elements of maximal sterile technique followed      Skin preparation:  Chlorhexidine    Skin preparation agent: Skin preparation agent completely dried prior to procedure    Anesthesia:     Anesthesia method:  Local infiltration    Local anesthetic:  Lidocaine 1% WITH epi  Procedure details:     Location:  R internal jugular    Patient  position:  Reverse Trendelenburg    Procedural supplies:  Triple lumen    Landmarks identified: yes      Ultrasound guidance: yes      Ultrasound guidance timing: real time      Sterile ultrasound techniques: Sterile gel and sterile probe covers were used      Number of attempts:  1    Successful placement: yes    Post-procedure details:     Post-procedure:  Dressing applied and line sutured    Assessment:  Blood return through all ports, no pneumothorax on x-ray, free fluid flow and placement verified by x-ray    Procedure completion:  Tolerated  Intubation    Date/Time: 01/26/2022 7:48 AM  Performed by: Eustace Quail, MD  Authorized by: Eustace Quail, MD     Consent:     Consent obtained:  Emergent situation    Consent given by:  Healthcare agent    Risks, benefits, and alternatives were discussed: yes      Risks discussed:  Aspiration, death and hypoxia    Alternatives discussed:  Alternative treatment and observation  Universal protocol:     Procedure explained and questions answered to patient or proxy's satisfaction: yes      Relevant documents present and verified: yes      Test results available: yes      Imaging studies available: yes      Required blood products, implants, devices, and special equipment available: yes      Patient identity confirmed:  Verbally with patient  Pre-procedure details:     Indication: failure to protect airway and predicted clinical deterioration      Patient status:  Altered mental status    Look externally: no concerns      Pharmacologic strategy: RSI      Induction agents:  Ketamine    Paralytics:  Succinylcholine  Procedure details:  Preoxygenation:  Nasal cannula    Intubation method:  Oral    Intubation technique: video assisted      Laryngoscope blade:  Mac 4 (Macro for video laryngoscope)    Bougie used: yes      Grade view: II      Tube size (mm):  8.0    Tube type:  Cuffed    Number of attempts:  1    Ventilation between attempts: no      Tube visualized  through cords: yes    Placement assessment:     ETT at teeth/gumline (cm):  22    Tube secured with:  ETT holder    Breath sounds:  Equal and absent over the epigastrium    Placement verification: chest rise, colorimetric ETCO2, CXR verification, direct visualization and tube exhalation      CXR findings:  Low  Post-procedure details:     Procedure completion:  Tolerated    Complications: hypotension    Comments:      Following chest x-ray to be retracted 2 cm    .    EKG read by me shows sinus rhythm with a rate of 92, normal axis, QTC is 435.  I do not appreciate significant ST or T-wave change no delta waves are S1 Q3 T3 pattern.    MDM:    Altered mental status.  DX includes polysubstance abuse, stroke, seizure, metabolic encephalopathy amongst other critical pathology.  Noncontrast head CT showed no acute abnormality.  Vital within normal limits.  Initial troponin 69.  ProBNP is over 7000.  Ammonia and lactate normal.      ED Course as of 01/26/22 0811   Fri Jan 25, 2022   2123 XR AP MOBILE CHEST   2156 I have called to patient's bedside.  She is now acutely altered essentially unresponsive.  She is a rightward fixed gaze.  Pupils are 3 mm unreactive.  She does withdraw from pain.  She moans.  There is no obvious convulsive activity.  Glucose grossly within normal limits.  Trialed 2 mg of Ativan without significant response.  Plan repeat CT and intubation for airway protection.   Sat Jan 26, 2022   0053 TROPONIN-I(!): 164   0744 CT ANGIO INTRA-EXTRA CRANIAL W/WO CONTRAST      Patient was successfully intubated patient tolerated procedure well but had postprocedure hypotension requiring pressors.  Surprisingly, once succinylcholine wore off patient did begin to buck the vent and was exceedingly difficult to sedate.  This marked improvement in her mental status but given that this was her second witnessed event chose to keep the patient intubated for anticipated course and  airway protection.  She was  re-evaluated at approximately 10-15 minutes intervals for the first 3 hours following intubation.  Please see nursing documentation.  In terms of additional workup serial troponins showed significant rise.   CT angios of the head and neck did not reveal any new abnormality.  Chest x-ray showed patchy bilateral airspace disease.  Patient started on heparin drip pulses are levofloxacin.  Patient also given a gram of Keppra to address possible seizure.   For sedation package was titrated and adjusted patient remains pressor dependent.  See nursing documentation.  [facility] does not have available beds at this time.  Case discussed with [facility] attending [facility] who kindly accepted for transfer.  We currently do not have available transport by ground or air.  Continue to monitor.  End of shift..  All pertinent details sign-out oncoming provider.  Critical Care Time:  Total critical care time spent in direct care of this patient at high risk based on presenting history/exam/and complaint, including initial evaluation and stabilization, review of data, re-examination, discussion with admitting and consulting services to arrange definitive care, and exclusive of any procedures performed, was 120 minutes.        Transfered to Another Facility  Clinical Impression   Altered mental status, unspecified altered mental status type (Primary)   NSTEMI (non-ST elevated myocardial infarction) (CMS HCC)   Acute respiratory failure, unspecified whether with hypoxia or hypercapnia (CMS HCC)   Acute pneumonia     Medications Administered in the ED   midazolam (VERSED) 5 mg/mL injection (0 mg Intravenous Not Given 01/26/22 0410)   iohexol (OMNIPAQUE 350) infusion (has no administration in time range)   norepinephrine (LEVOPHED) 8 mg in NS 500 mL infusion (4 mcg/min Intravenous Rate Change 01/26/22 0628)   ketamine 100 mg/mL injection (0 mg Intravenous Not Given 01/26/22 0409)   ketamine 500 mg in NS 50 mL (tot vol) infusion for ICU  continuous sedation (0 mg/kg/hr  56.4 kg (Adjusted) Intravenous Paused 01/25/22 2350)   fentaNYL (PF) (SUBLIMAZE) 1,000 mcg in NS 100 mL (tot vol) infusion (100 mcg/hr Intravenous Rate Change 01/26/22 0410)   propofol (DIPRIVAN) 10 mg/mL premix infusion (25 mcg/kg/min  56.4 kg (Adjusted) Intravenous Rate Change 01/26/22 0640)   fentaNYL (SUBLIMAZE) 50 mcg/mL injection (0 mcg Intravenous Not Given 01/26/22 0100)   levETIRAcetam (KEPPRA) 1,000 mg in NS 100 mL IVPB (0 mg Intravenous Stopped 01/26/22 0122)   midazolam (VERSED) 100 mg in NS 178m premix infusion (6 mg/hr Intravenous New Bag/New Syringe 01/26/22 0621)   heparin 25,000 units in 0.45% NS 250 mL infusion (12 Units/kg/hr  56.4 kg (Adjusted) Intravenous New Bag/New Syringe 01/26/22 0650)   ketamine 100 mg/mL injection (100 mg Intravenous Given 01/25/22 2247)   succinylcholine (ANECTINE) 20 mg/mL injection (100 mg Intravenous Given 01/25/22 2248)   LORazepam (ATIVAN) 2 mg/mL injection (1 mg Intravenous Given 01/25/22 2220)   ketamine 100 mg/mL injection (50 mg Intravenous Given 01/25/22 2340)   ketamine 100 mg/mL injection (50 mg Intravenous Given 01/25/22 2350)   ampicillin-sulbactam (UNASYN) 3 g in NS 100 mL IVPB minibag (0 g Intravenous Stopped 01/26/22 0405)   heparin 5,000 units/mL initial IV BOLUS (3,500 Units Intravenous Given 01/26/22 0649)        Current Discharge Medication List      CONTINUE these medications - NO CHANGES were made during your visit.      Details   albuterol sulfate 90 mcg/Actuation HFA Aerosol Inhaler oral inhaler   1-2 Puffs, Inhalation, EVERY 6 HOURS PRN  Refills: 0     Amlodipine-Benazepril 10-40 mg Capsule   1 Capsule, Oral, 2 TIMES DAILY  Refills: 0     atorvastatin 40 mg Tablet  Commonly known as: LIPITOR   40 mg, Oral, EVERY EVENING  Refills: 0     budesonide-formoteroL 160-4.5 mcg/actuation oral inhaler  Commonly known as: SYMBICORT   2 Puffs, Inhalation, 2 TIMES DAILY  Refills: 0     buprenorphine-naloxone 8-2 mg Tablet,  Sublingual  Commonly known as: SUBOXONE   1 Tablet, Sublingual, 3 TIMES DAILY PRN  Refills: 0     clopidogreL 75 mg Tablet  Commonly known as: PLAVIX   75 mg, Oral, DAILY  Refills: 0     doxycycline 100 mg Tablet   100 mg, Oral, 2 TIMES DAILY  Qty: 10 Tablet  Refills: 0  lisdexamfetamine 70 mg Capsule  Commonly known as: VYVANSE   70 mg, Oral, DAILY  Refills: 0     predniSONE 20 mg Tablet  Commonly known as: DELTASONE   40 mg, Oral, DAILY  Qty: 10 Tablet  Refills: 0     pregabalin 200 mg Capsule  Commonly known as: LYRICA   200 mg, Oral, ONCE  Refills: 0     QUEtiapine 25 mg Tablet  Commonly known as: SEROQUEL   25 mg, Oral, NIGHTLY  Refills: 0

## 2022-01-25 NOTE — Ancillary Notes (Signed)
PT. INTUBATED WITH 8.0 ET-TUBE BY DR. Katrinka Blazing 22 @ LIP LINE  VENT SETTINGS: AC VT 390 RR 16 FIO2 75% PEEP +5.Marland KitchenMarland KitchenMarland Kitchen

## 2022-01-25 NOTE — ED Nurses Note (Signed)
2247 ketamine 100 mg   2248 succ 100 mg   2248 ET tube 8.0 ET tube placed by Dr. Nelson Chimes. 22 @ lip , yellow color change CO2, bilateral breath sounds, negative epigastric sounds. X ray ordered.

## 2022-01-25 NOTE — ED Triage Notes (Signed)
EMS reports family called for pt not responding and not being able to move left side. Pt had X 30 second tonic clonic seizure per EMS. Family unsure of hx . Narcan given en route

## 2022-01-26 ENCOUNTER — Emergency Department (HOSPITAL_BASED_OUTPATIENT_CLINIC_OR_DEPARTMENT_OTHER): Payer: Medicare Other

## 2022-01-26 ENCOUNTER — Emergency Department (EMERGENCY_DEPARTMENT_HOSPITAL): Payer: Medicare Other

## 2022-01-26 ENCOUNTER — Other Ambulatory Visit: Payer: Self-pay

## 2022-01-26 DIAGNOSIS — Z5321 Procedure and treatment not carried out due to patient leaving prior to being seen by health care provider: Secondary | ICD-10-CM | POA: Insufficient documentation

## 2022-01-26 DIAGNOSIS — Z452 Encounter for adjustment and management of vascular access device: Secondary | ICD-10-CM

## 2022-01-26 DIAGNOSIS — Z978 Presence of other specified devices: Secondary | ICD-10-CM

## 2022-01-26 DIAGNOSIS — J90 Pleural effusion, not elsewhere classified: Secondary | ICD-10-CM

## 2022-01-26 LAB — ARTERIAL BLOOD GAS/LACTATE
BASE EXCESS (ARTERIAL): 3.1 mmol/L — ABNORMAL HIGH (ref <=2.0)
BICARBONATE (ARTERIAL): 30.7 mmol/L — ABNORMAL HIGH (ref 20.0–26.0)
CARBOXYHEMOGLOBIN: 1.7 % — ABNORMAL HIGH (ref <=1.5)
LACTATE: 0.9 mmol/L (ref <=4.0)
OXYHEMOGLOBIN: 97.9 % (ref 88.0–100.0)
PCO2 (ARTERIAL): 58 mm/Hg — ABNORMAL HIGH (ref 35–45)
PH (ARTERIAL): 7.34 — ABNORMAL LOW (ref 7.35–7.45)
PO2 (ARTERIAL): 138 mm/Hg — ABNORMAL HIGH (ref 80–100)

## 2022-01-26 LAB — TROPONIN-I: TROPONIN I: 642 ng/L — ABNORMAL HIGH (ref <15)

## 2022-01-26 LAB — ECG 12 LEAD
Atrial Rate: 92 {beats}/min
Calculated P Axis: 73 degrees
Calculated R Axis: 63 degrees
Calculated T Axis: 38 degrees
PR Interval: 128 ms
QRS Duration: 78 ms
QT Interval: 352 ms
QTC Calculation: 435 ms
Ventricular rate: 92 {beats}/min

## 2022-01-26 MED ORDER — FENTANYL (PF) 50 MCG/ML INJECTION SOLUTION
100.0000 ug | INTRAMUSCULAR | Status: DC
Start: 2022-01-26 — End: 2022-01-26
  Administered 2022-01-26: 0 ug via INTRAVENOUS

## 2022-01-26 MED ORDER — MIDAZOLAM (PF) 1 MG/ML IN 0.9 % SODIUM CHLORIDE INTRAVENOUS SOLUTION
INTRAVENOUS | Status: AC
Start: 2022-01-26 — End: 2022-01-26
  Filled 2022-01-26: qty 100

## 2022-01-26 MED ORDER — PROPOFOL 10 MG/ML INTRAVENOUS EMULSION
INTRAVENOUS | Status: AC
Start: 2022-01-26 — End: 2022-01-26
  Filled 2022-01-26: qty 100

## 2022-01-26 MED ORDER — SODIUM CHLORIDE 0.9 % INTRAVENOUS PIGGYBACK
3.0000 g | INTRAVENOUS | Status: AC
Start: 2022-01-26 — End: 2022-01-26
  Administered 2022-01-26: 0 g via INTRAVENOUS
  Administered 2022-01-26: 3 g via INTRAVENOUS

## 2022-01-26 MED ORDER — HEPARIN (PORCINE) 5,000 UNIT/ML INJECTION SOLUTION
INTRAMUSCULAR | Status: AC
Start: 2022-01-26 — End: 2022-01-26
  Filled 2022-01-26: qty 1

## 2022-01-26 MED ORDER — HEPARIN (PORCINE) 5,000 UNITS/ML BOLUS
60.0000 [IU]/kg | INTRAMUSCULAR | Status: AC
Start: 2022-01-26 — End: 2022-01-26
  Administered 2022-01-26: 3500 [IU] via INTRAVENOUS

## 2022-01-26 MED ORDER — MIDAZOLAM (PF) 1 MG/ML IN 0.9 % SODIUM CHLORIDE INTRAVENOUS SOLUTION
0.5000 mg/h | INTRAVENOUS | Status: DC
Start: 2022-01-26 — End: 2022-01-26
  Administered 2022-01-26: 6 mg/h via INTRAVENOUS
  Administered 2022-01-26: 8 mg/h via INTRAVENOUS
  Administered 2022-01-26: 10 mg/h via INTRAVENOUS
  Administered 2022-01-26: 0.5 mg/h via INTRAVENOUS

## 2022-01-26 MED ORDER — LEVETIRACETAM 500 MG/5 ML INTRAVENOUS SOLUTION
INTRAVENOUS | Status: AC
Start: 2022-01-26 — End: 2022-01-26
  Filled 2022-01-26: qty 10

## 2022-01-26 MED ORDER — HEPARIN (PORCINE) 25,000 UNIT/250 ML IN 0.45 % SODIUM CHLORIDE IV SOLN
INTRAVENOUS | Status: AC
Start: 2022-01-26 — End: 2022-01-26
  Filled 2022-01-26: qty 250

## 2022-01-26 MED ORDER — HEPARIN (PORCINE) 25,000 UNIT/250 ML IN 0.45 % SODIUM CHLORIDE IV SOLN
12.0000 [IU]/kg/h | INTRAVENOUS | Status: DC
Start: 2022-01-26 — End: 2022-01-26
  Administered 2022-01-26: 12 [IU]/kg/h via INTRAVENOUS

## 2022-01-26 MED ORDER — SODIUM CHLORIDE 0.9 % INTRAVENOUS SOLUTION
25.0000 ug/h | INTRAVENOUS | Status: DC
Start: 2022-01-26 — End: 2022-01-26
  Administered 2022-01-26 (×2): 25 ug/h via INTRAVENOUS
  Administered 2022-01-26: 250 ug/h via INTRAVENOUS
  Administered 2022-01-26: 100 ug/h via INTRAVENOUS
  Administered 2022-01-26: 200 ug/h via INTRAVENOUS
  Administered 2022-01-26: 100 ug/h via INTRAVENOUS

## 2022-01-26 MED ORDER — FENTANYL (PF) 50 MCG/ML INJECTION WRAPPER
INJECTION | INTRAMUSCULAR | Status: AC
Start: 2022-01-26 — End: 2022-01-26
  Filled 2022-01-26: qty 20

## 2022-01-26 MED ORDER — KETAMINE 100 MG/ML INJECTION SOLUTION
50.0000 mg | INTRAMUSCULAR | Status: AC
Start: 2022-01-26 — End: 2022-01-25
  Administered 2022-01-25: 50 mg via INTRAVENOUS

## 2022-01-26 MED ORDER — AMPICILLIN-SULBACTAM 3 GRAM SOLUTION FOR INJECTION
INTRAMUSCULAR | Status: AC
Start: 2022-01-26 — End: 2022-01-26
  Filled 2022-01-26: qty 3000

## 2022-01-26 MED ORDER — PROPOFOL 10 MG/ML INTRAVENOUS EMULSION
20.0000 ug/kg/min | INTRAVENOUS | Status: DC
Start: 2022-01-26 — End: 2022-01-26
  Administered 2022-01-26: 25 ug/kg/min via INTRAVENOUS
  Administered 2022-01-26: 28 ug/kg/min via INTRAVENOUS
  Administered 2022-01-26: 20 ug/kg/min via INTRAVENOUS
  Administered 2022-01-26: 30 ug/kg/min via INTRAVENOUS
  Administered 2022-01-26: 20 ug/kg/min via INTRAVENOUS
  Administered 2022-01-26: 39.894 ug/kg/min via INTRAVENOUS
  Administered 2022-01-26: 26 ug/kg/min via INTRAVENOUS

## 2022-01-26 MED ORDER — SODIUM CHLORIDE 0.9 % INTRAVENOUS SOLUTION
1000.0000 mg | Freq: Two times a day (BID) | INTRAVENOUS | Status: DC
Start: 2022-01-26 — End: 2022-01-26
  Administered 2022-01-26: 0 mg via INTRAVENOUS
  Administered 2022-01-26: 1000 mg via INTRAVENOUS
  Filled 2022-01-26: qty 10

## 2022-01-26 NOTE — Ancillary Notes (Signed)
Ventilator check at this time.  HR= 58, O2 saturation= 95%.  BP= 116/74.  Patient appears to be resting comfortably.  Vent. Check is as follows:  AC  16, Vt= 390 ml, FIO2= 50%, PEEP +5.  Family is at the bedside.  Breath sounds-  Mostly dimished.

## 2022-01-26 NOTE — ED Nurses Note (Signed)
Pt resting quietly with RASS -4.  ET tube secured at 20@ the lip.  Lung sounds equal bilaterally.  Vent settings A/c 16, TV 390, PEEP 5.0 and O2 75%.  VSS -  Sinus rhythm on monitor.  Central line placed and infusing w/o complication.  Drip rates as follows: Levophed 15 mcg/min, Versed 10 mg/hr, Fentanyl 250 mcg/min, Propofol 30 mcg/kg/min.  48F foley draining to gravity. Approx 400cc dark yellow urine in collection bag.. 97F OG tube secured at 60 at the lip and hooked to LIS.

## 2022-01-26 NOTE — ED Nurses Note (Signed)
Discussed patient weight and "adjusted weight" heparin dosage with MD.  Per MD instructions, administer "adjusted weight" doses of heparin bolus and drip.

## 2022-01-26 NOTE — ED Nurses Note (Signed)
Patient being transferred to [facility] by [facility]. Continue on Levophed at 58mls/hr, Fentanyl at 25 mcg/hr, Propofol at 8.72ml/hr, Versed at 3ml/hr;  Heparin drip at 6.3ml/hr. Continues on vent. No problems noted when leaving unit.

## 2022-01-26 NOTE — ED Nurses Note (Signed)
Patient accepted to [facility] to Dr. Clemmie Krill.     Transportation attempts made to     [facility]    No CCT available and due to weather they can not fly

## 2022-01-26 NOTE — ED Nurses Note (Signed)
[facility] called for possible admission, they have no unit beds available at this time    Additional transfer attempts made to-  [facility]  [facility]  [facility]

## 2022-01-26 NOTE — ED Nurses Note (Signed)
Patient appears comfortable.  RASS -4.  IV infusing w/o complication.  VSS.  Current drip rates - propofol at 70mcg/min, Levo at 62mcg/min, versed at 8mg /hr, fentanyl at 137mcg/hr.  No change in vent settings.  Family remains at bedside.  Awaiting transport to [place].

## 2022-01-26 NOTE — Ancillary Notes (Signed)
ET TUBE PULLED BACK TO 20 AT LIP LINE PER DR. OTENG  FIO2 DECREASED TO 65% SPO2 100%

## 2022-01-26 NOTE — ED Nurses Note (Signed)
Report called to Occupational psychologist at [facility]. Time allowed for questions and discussion.

## 2022-01-26 NOTE — Ancillary Notes (Signed)
FIO2 DECREASED TO 50%.Marland KitchenMarland KitchenMarland Kitchen

## 2022-01-26 NOTE — ED Nurses Note (Signed)
Levophed decreased to 12 mcg/min.  Son in room with patient and updated on condition.

## 2022-01-27 LAB — ADULT ROUTINE BLOOD CULTURE, SET OF 2 BOTTLES (BACTERIA AND YEAST): BLOOD CULTURE, ROUTINE: NO GROWTH

## 2022-01-30 LAB — ADULT ROUTINE BLOOD CULTURE, SET OF 2 BOTTLES (BACTERIA AND YEAST): BLOOD CULTURE, ROUTINE: NO GROWTH

## 2022-01-31 LAB — ADULT ROUTINE BLOOD CULTURE, SET OF 2 BOTTLES (BACTERIA AND YEAST)
BLOOD CULTURE, ROUTINE: NO GROWTH
BLOOD CULTURE, ROUTINE: NO GROWTH

## 2022-03-05 IMAGING — CT CT HEAD W/O CM
4 of 5 series · 16 of 47 positions shown, 18 images · non-contrast
Comparison: None.

CLINICAL DATA: 60-year-old female with altered mental status.

EXAM:
CT HEAD WITHOUT CONTRAST
TECHNIQUE: Contiguous axial images were obtained from the base of the skull
through the vertex without intravenous contrast.

[Series 6: head without · axial · non-contrast · 0.39mm/px · z∈[+942,+1067]mm · 6 of 35 slices shown, 8 images]
[im 5/35  brain]
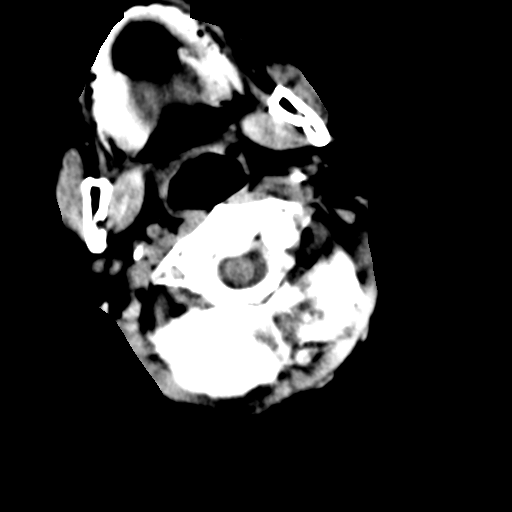
[im 5/35  bone]
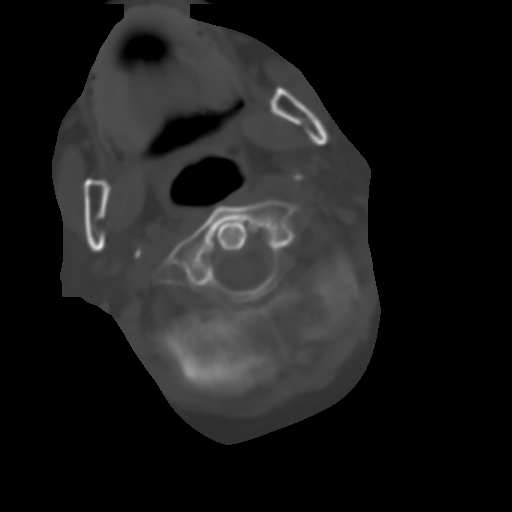
[im 10/35  brain]
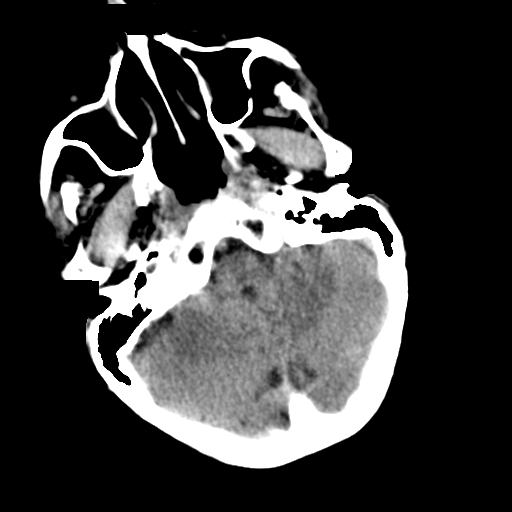
[im 15/35  brain]
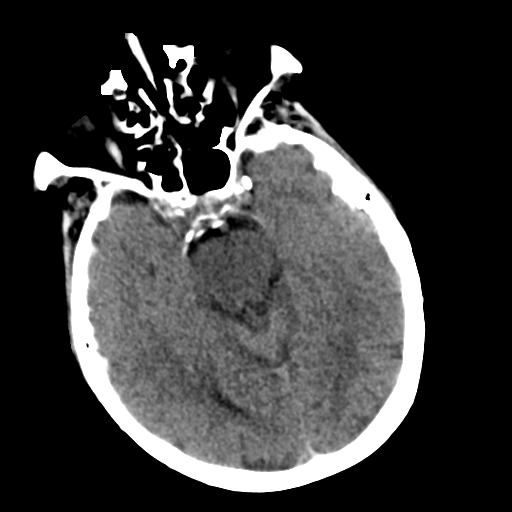
[im 20/35  brain]
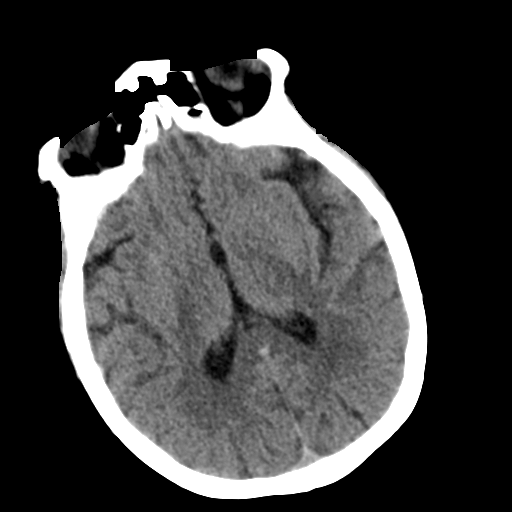
[im 25/35  brain]
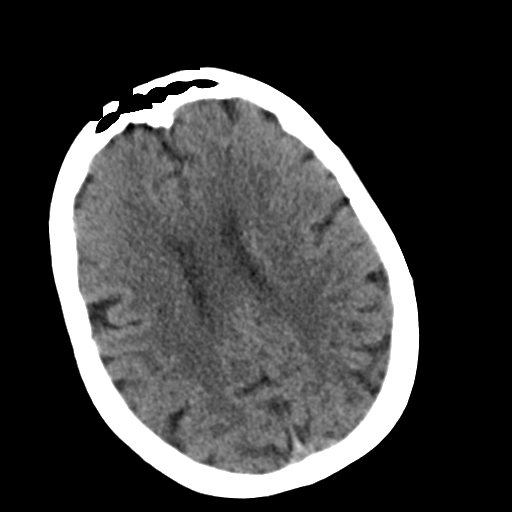
[im 25/35  bone]
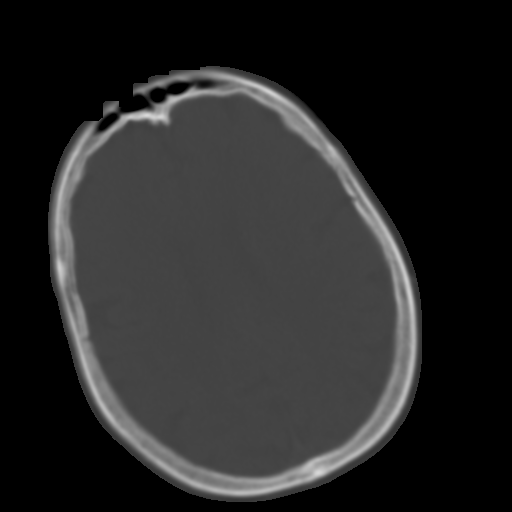
[im 30/35  brain]
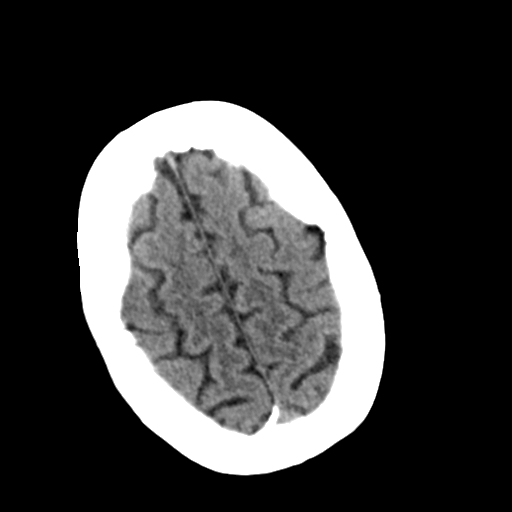

[Series 7: head bone · axial · 0.39mm/px · z∈[+940,+998]mm · 4 of 88 slices shown]
[im 10/88  bone]
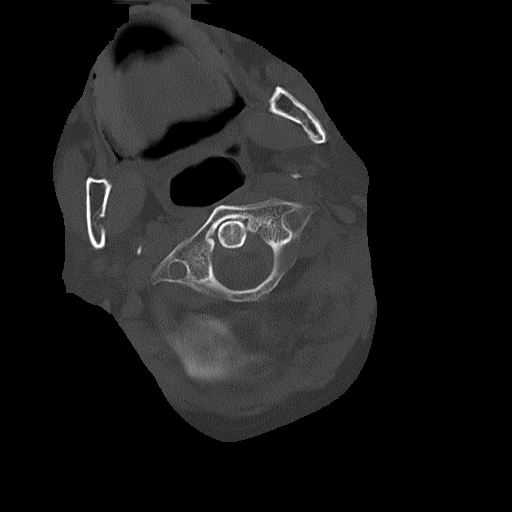
[im 20/88  bone]
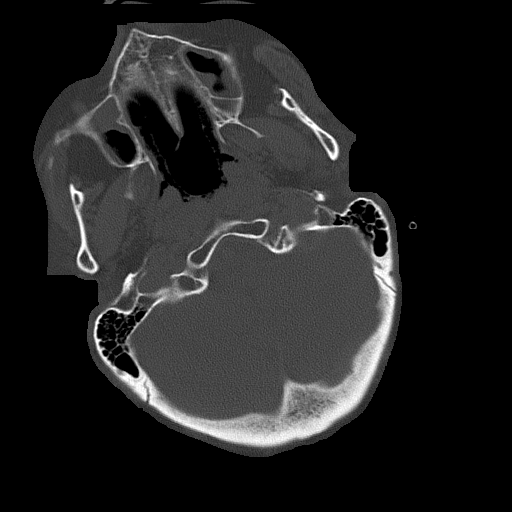
[im 30/88  bone]
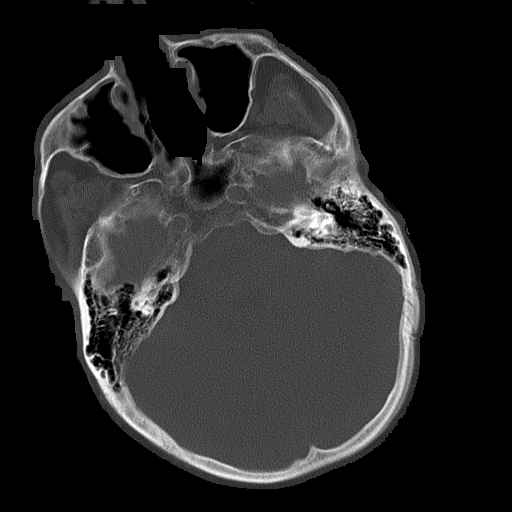
[im 39/88  bone]
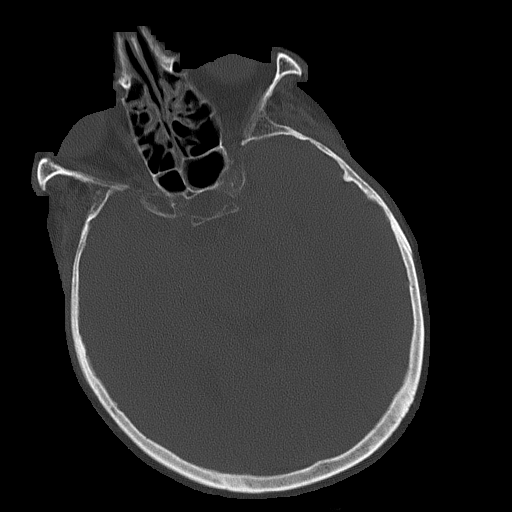

[Series 8: head without cor · coronal · non-contrast · 0.29mm/px · 3 of 67 slices shown]
[im 23/67  brain]
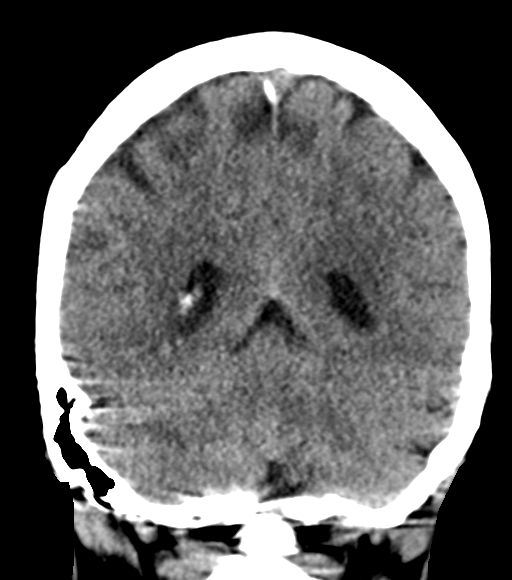
[im 30/67  brain]
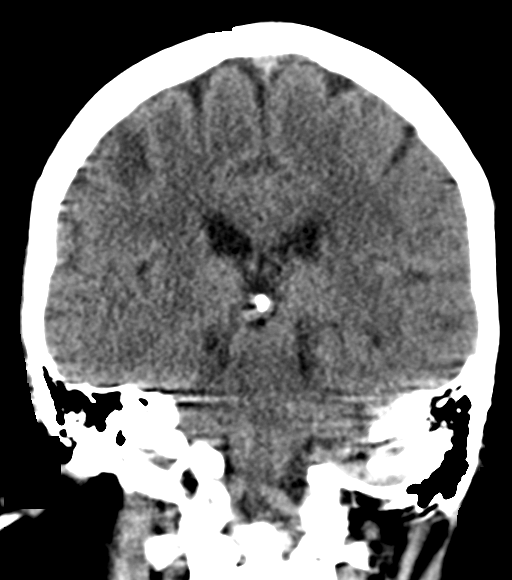
[im 37/67  brain]
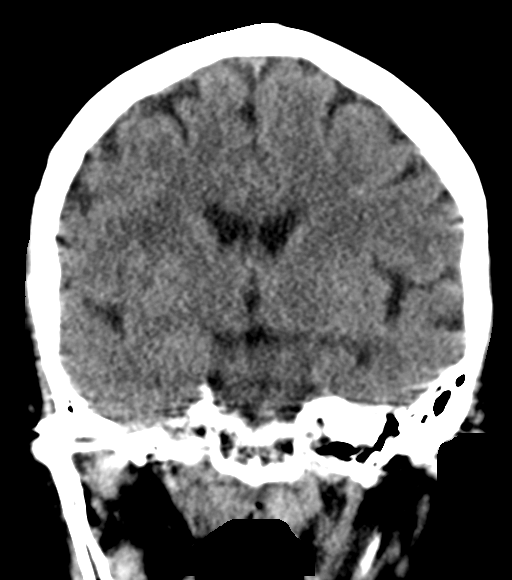

[Series 9: head without sag · sagittal · non-contrast · 0.30mm/px · 3 of 53 slices shown]
[im 18/53  brain]
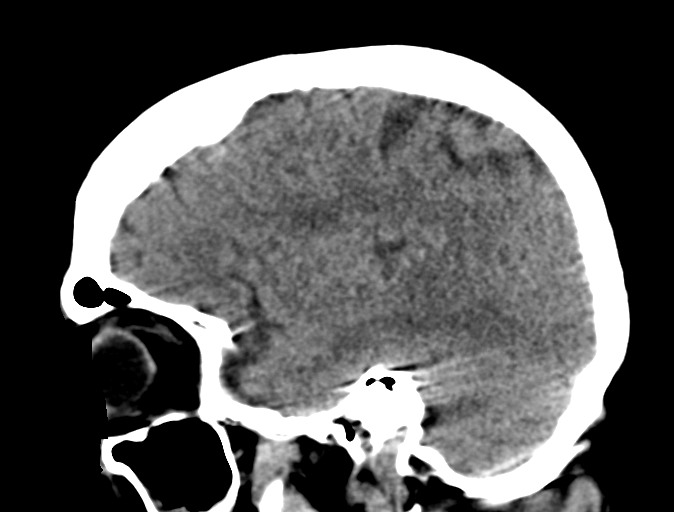
[im 27/53  brain]
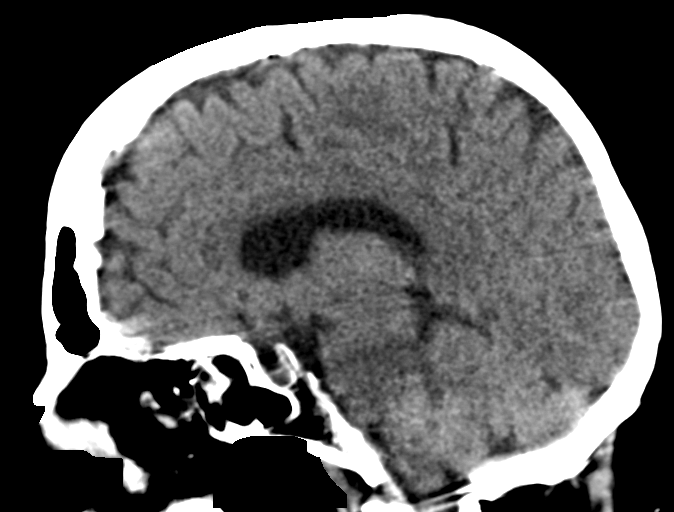
[im 35/53  brain]
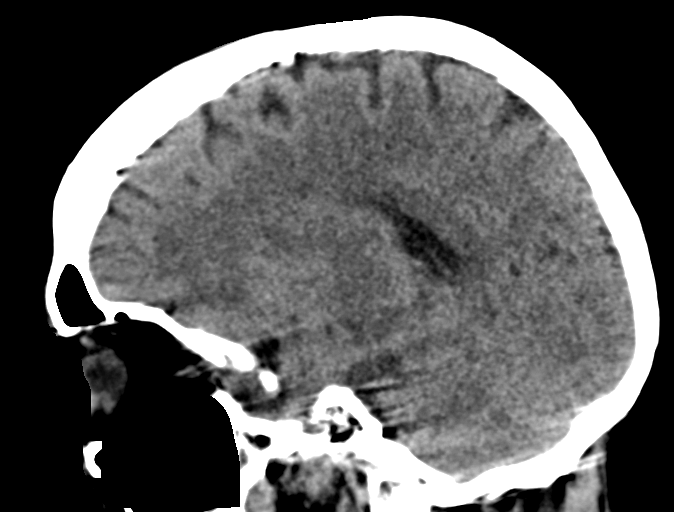

[16 of 47 positions shown; findings below may reference images not displayed]

FINDINGS: Study is mildly degraded by motion artifact despite repeated imaging
attempts.

Brain: Cerebral volume is within normal limits for age. No midline
shift, ventriculomegaly, mass effect, evidence of mass lesion,
intracranial hemorrhage or evidence of cortically based acute
infarction. Largely normal for age gray-white matter
differentiation, minimal to mild scattered white matter hypodensity.
No cortical encephalomalacia identified.

Vascular: Calcified atherosclerosis at the skull base. No suspicious
intracranial vascular hyperdensity.

Skull: No acute osseous abnormality identified.

Sinuses/Orbits: Mild bilateral paranasal sinus mucosal thickening,
relatively sparing the frontal sinuses. No sinus fluid level
identified.

Other: Bubbly opacity throughout the bilateral nasal cavity and
nasopharynx. Tympanic cavities and mastoids are clear.

Visualized orbit soft tissues are within normal limits. Visualized
scalp soft tissues are within normal limits.
IMPRESSION: 1. Mild motion artifact. Normal for age non contrast CT appearance
of the brain.
2. Retained secretions in the nasopharynx and mild bilateral
paranasal sinus inflammation.

## 2022-04-15 ENCOUNTER — Encounter (HOSPITAL_BASED_OUTPATIENT_CLINIC_OR_DEPARTMENT_OTHER): Payer: Self-pay

## 2022-04-15 ENCOUNTER — Emergency Department (HOSPITAL_BASED_OUTPATIENT_CLINIC_OR_DEPARTMENT_OTHER): Payer: Medicare Other

## 2022-04-15 ENCOUNTER — Emergency Department
Admission: EM | Admit: 2022-04-15 | Discharge: 2022-04-16 | Disposition: A | Discharge status: Home / Self Care | Payer: Medicare Other | Attending: Emergency Medicine | Admitting: Emergency Medicine

## 2022-04-15 ENCOUNTER — Other Ambulatory Visit: Payer: Self-pay

## 2022-04-15 DIAGNOSIS — R1030 Lower abdominal pain, unspecified: Secondary | ICD-10-CM | POA: Insufficient documentation

## 2022-04-15 DIAGNOSIS — J449 Chronic obstructive pulmonary disease, unspecified: Secondary | ICD-10-CM | POA: Insufficient documentation

## 2022-04-15 DIAGNOSIS — Z9981 Dependence on supplemental oxygen: Secondary | ICD-10-CM | POA: Insufficient documentation

## 2022-04-15 DIAGNOSIS — F17210 Nicotine dependence, cigarettes, uncomplicated: Secondary | ICD-10-CM | POA: Insufficient documentation

## 2022-04-15 DIAGNOSIS — E860 Dehydration: Secondary | ICD-10-CM

## 2022-04-15 LAB — COMPREHENSIVE METABOLIC PANEL, NON-FASTING
ALBUMIN/GLOBULIN RATIO: 0.6 — ABNORMAL LOW (ref 0.8–1.4)
ALBUMIN: 2.4 g/dL — ABNORMAL LOW (ref 3.4–5.0)
ALKALINE PHOSPHATASE: 88 U/L (ref 46–116)
ALT (SGPT): 12 U/L (ref <=78)
ANION GAP: 9 mmol/L — ABNORMAL LOW (ref 10–20)
AST (SGOT): 15 U/L (ref 15–37)
BILIRUBIN TOTAL: 1 mg/dL (ref 0.2–1.0)
BUN/CREA RATIO: 14
BUN: 10 mg/dL (ref 7–18)
CALCIUM, CORRECTED: 10.2 mg/dL
CALCIUM: 8.6 mg/dL (ref 8.5–10.1)
CHLORIDE: 100 mmol/L (ref 98–107)
CO2 TOTAL: 38 mmol/L — ABNORMAL HIGH (ref 21–32)
CREATININE: 0.72 mg/dL (ref 0.55–1.02)
ESTIMATED GFR: 95 mL/min/{1.73_m2} (ref >59)
GLOBULIN: 4
GLUCOSE: 85 mg/dL (ref 74–106)
OSMOLALITY, CALCULATED: 291 mOsm/kg — ABNORMAL HIGH (ref 270–290)
POTASSIUM: 3.5 mmol/L (ref 3.5–5.1)
PROTEIN TOTAL: 6.4 g/dL (ref 6.4–8.2)
SODIUM: 147 mmol/L — ABNORMAL HIGH (ref 136–145)

## 2022-04-15 LAB — ETHANOL, SERUM: ETHANOL: <3 mg/dL (ref <=3)

## 2022-04-15 LAB — THYROID STIMULATING HORMONE (SENSITIVE TSH): TSH: 0.484 u[IU]/mL (ref 0.358–3.740)

## 2022-04-15 LAB — LIPASE: LIPASE: 29 U/L — ABNORMAL LOW (ref 73–393)

## 2022-04-15 LAB — AMMONIA: AMMONIA: <10 umol/L — ABNORMAL LOW (ref 10–54)

## 2022-04-15 MED ORDER — LACTATED RINGERS IV BOLUS
1000.0000 mL | INJECTION | Status: AC
Start: 2022-04-15 — End: 2022-04-16
  Administered 2022-04-15: 1000 mL via INTRAVENOUS

## 2022-04-15 NOTE — ED Triage Notes (Signed)
Per EMS, patient c/o lower abdominal pain for a couple of days. EMS reports that the patient reported relief of pain when she got up and ambulated to the EMS stretcher. Denies nausea, vomiting, diarrhea, or constipation. EMS reports that the patient has been drowsy.

## 2022-04-15 NOTE — ED Attending Note (Addendum)
[facility]  [place] emergency department         HISTORY OF PRESENT ILLNESS     Date:  04/15/2022  Patient's Name:  Brianna Mcgrath  Date of Birth:  [birth year]    Patient is a 61 year old with COPD on 3 L nasal cannula.  Patient presents with abdominal pain lower abdomen with cramping off and on for the last 3 days.  She denies nausea vomiting or diarrhea.  She denies chest pain flank pain or hematuria. patient is in no obvious distress while being evaluated the emergency room.  Patient's husband came to the ER after the EMS and states patient also has been a little bit confused at home and has had some shortness of breath.  Patient on 2 L nasal cannula there is maintaining oxygen sat of 98%.  She is in no obvious respiratory distress.          Review of Systems     Review of Systems   Constitutional: Negative.    HENT: Negative.    Eyes: Negative.    Respiratory: Negative.    Gastrointestinal: Positive for abdominal pain.   Genitourinary: Negative.    All other systems reviewed and are negative.      Previous History     Past Medical History:  Past Medical History:   Diagnosis Date   . Chronic hypoxemic respiratory failure (CMS HCC)    . Congestive heart failure (CMS HCC)    . COPD (chronic obstructive pulmonary disease) (CMS HCC)    . Drug abuse (CMS HCC)    . HTN (hypertension)    . [facility]        Past Surgical History:  Past Surgical History:   Procedure Laterality Date   . Hx hysterectomy         Social History:  Social History     Tobacco Use   . Smoking status: Every Day     Packs/day: 1.00     Types: Cigarettes     Passive exposure: Current   . Smokeless tobacco: Never   Substance Use Topics   . Alcohol use: Not Currently   . Drug use: Yes     Types: Amphetamine     Comment: Patient denies however UDS positive     Social History     Substance and Sexual Activity   Drug Use Yes   . Types: Amphetamine    Comment: Patient denies however UDS positive        Family History:  Family History   Problem Relation Age of Onset   . Heart Attack Other    . Lung Cancer Other        Medication History:  Current Outpatient Medications   Medication Sig   . albuterol sulfate 90 mcg/Actuation Inhalation HFA Aerosol Inhaler oral inhaler Take 1-2 Puffs by inhalation Every 6 hours as needed   . Amlodipine-Benazepril 10-40 mg Oral Capsule Take 1 Capsule by mouth Twice daily   . atorvastatin (LIPITOR) 40 mg Oral Tablet Take 1 Tablet (40 mg total) by mouth Every evening   . budesonide-formoteroL (SYMBICORT) 160-4.5 mcg/actuation Inhalation oral inhaler Take 2 Puffs by inhalation Twice daily   . buprenorphine-naloxone (SUBOXONE) 8-2 mg Sublingual Tablet, Sublingual Place 1 Tablet under the tongue Three times a day as needed   . clopidogreL (PLAVIX) 75 mg Oral Tablet Take 1 Tablet (75 mg total) by mouth Once a day   . lisdexamfetamine (VYVANSE) 70 mg Oral  Capsule Take 1 Capsule (70 mg total) by mouth Once a day   . pregabalin (LYRICA) 200 mg Oral Capsule Take 1 Capsule (200 mg total) by mouth One time   . QUEtiapine (SEROQUEL) 25 mg Oral Tablet Take 1 Tablet (25 mg total) by mouth Every night   . TRELEGY ELLIPTA 100-62.5-25 mcg Inhalation Disk with Device TAKE 1 PUFF BY MOUTH EVERY DAY       Allergies:  No Known Allergies    Physical Exam     Vitals:    BP 135/75   Pulse 81   Temp 36.3 C (97.3 F)   Resp 20   Ht 1.651 m (5\' 5" )   Wt 54.4 kg (120 lb)   SpO2 91%   BMI 19.97 kg/m           Physical Exam  Vitals and nursing note reviewed.   Constitutional:       General: She is not in acute distress.     Appearance: She is well-developed.   HENT:      Head: Normocephalic and atraumatic.   Eyes:      Extraocular Movements: Extraocular movements intact.      Conjunctiva/sclera: Conjunctivae normal.   Cardiovascular:      Rate and Rhythm: Normal rate and regular rhythm.      Pulses: Normal pulses.      Heart sounds: No murmur heard.  Pulmonary:      Effort: Pulmonary effort is  normal. No respiratory distress.      Breath sounds: Normal breath sounds.   Abdominal:      General: Bowel sounds are normal.      Palpations: Abdomen is soft.      Tenderness: There is no abdominal tenderness.   Musculoskeletal:         General: No swelling. Normal range of motion.      Cervical back: Normal range of motion and neck supple.   Skin:     General: Skin is warm and dry.      Capillary Refill: Capillary refill takes less than 2 seconds.   Neurological:      General: No focal deficit present.      Mental Status: She is alert and oriented to person, place, and time.   Psychiatric:         Mood and Affect: Mood normal.         Diagnostic Studies/Treatment     Medications:  Medications Administered in the ED   LR bolus infusion 1,000 mL (1,000 mL Intravenous New Bag/New Syringe 04/15/22 2258)       New Prescriptions    No medications on file       Labs:    Results for orders placed or performed during the hospital encounter of 04/15/22 (from the past 12 hour(s))   AMMONIA   Result Value Ref Range    AMMONIA <10 (L) 10 - 54 umol/L   COMPREHENSIVE METABOLIC PANEL, NON-FASTING   Result Value Ref Range    SODIUM 147 (H) 136 - 145 mmol/L    POTASSIUM 3.5 3.5 - 5.1 mmol/L    CHLORIDE 100 98 - 107 mmol/L    CO2 TOTAL 38 (H) 21 - 32 mmol/L    ANION GAP 9 (L) 10 - 20 mmol/L    BUN 10 7 - 18 mg/dL    CREATININE 04/17/22 2.62 - 1.02 mg/dL    BUN/CREA RATIO 14     ESTIMATED GFR 95 >59 mL/min/1.57m^2  ALBUMIN 2.4 (L) 3.4 - 5.0 g/dL    CALCIUM 8.6 8.5 - 43.3 mg/dL    GLUCOSE 85 74 - 295 mg/dL    ALKALINE PHOSPHATASE 88 46 - 116 U/L    ALT (SGPT) 12 <=78 U/L    AST (SGOT) 15 15 - 37 U/L    BILIRUBIN TOTAL 1.0 0.2 - 1.0 mg/dL    PROTEIN TOTAL 6.4 6.4 - 8.2 g/dL    ALBUMIN/GLOBULIN RATIO 0.6 (L) 0.8 - 1.4    OSMOLALITY, CALCULATED 291 (H) 270 - 290 mOsm/kg    CALCIUM, CORRECTED 10.2 mg/dL    GLOBULIN 4.0    LIPASE   Result Value Ref Range    LIPASE 29 (L) 73 - 393 U/L   ETHANOL, SERUM   Result Value Ref Range    ETHANOL <3  <=3 mg/dL   THYROID STIMULATING HORMONE (SENSITIVE TSH)   Result Value Ref Range    TSH 0.484 0.358 - 3.740 uIU/mL        Radiology:  XR CHEST AP  CT ABDOMEN PELVIS WO IV CONTRAST    XR CHEST AP   Final Result   NO ACUTE FINDINGS.         Radiologist location ID: [id number]         CT ABDOMEN PELVIS WO IV CONTRAST   Final Result   NO ACUTE FINDINGS AT THE ABDOMEN OR PELVIS ON NONCONTRAST CT.          One or more dose reduction techniques were used (e.g., Automated exposure control, adjustment of the mA and/or kV according to patient size, use of iterative reconstruction technique).         Radiologist location ID: [id number]             ECG:  NONE            Differential diagnosis    Abdominal pain, constipation, diverticulitis.  Shortness of breath COPD  Course/Disposition/Plan     Course:      CT abdomen pelvis negative for any pathology chest x-ray no acute process  Disposition:    Data Unavailable    Condition at Disposition:    Stable    Follow up:   No follow-up provider specified.    Clinical Impression:     Clinical Impression   Lower abdominal pain (Primary)         Tivis Ringer, MD

## 2022-04-16 LAB — URINALYSIS, MACRO/MICRO
BLOOD: NEGATIVE mg/dL
GLUCOSE: NEGATIVE mg/dL
KETONES: 15 mg/dL — AB
LEUKOCYTES: NEGATIVE WBCs/uL
NITRITE: NEGATIVE
PH: 6.5 (ref 4.6–8.0)
PROTEIN: 30 mg/dL — AB
SPECIFIC GRAVITY: 1.025 (ref 1.003–1.035)
UROBILINOGEN: 2 mg/dL (ref 0.2–1.0)

## 2022-04-16 LAB — URINALYSIS, MICROSCOPIC

## 2022-04-16 LAB — CBC WITH DIFF
BASOPHIL #: 0.02 10*3/uL (ref 0.00–0.30)
BASOPHIL %: 0 % (ref 0–3)
EOSINOPHIL #: 0.07 10*3/uL (ref 0.00–0.80)
EOSINOPHIL %: 1 % (ref 0–7)
HCT: 46.3 % (ref 37.0–47.0)
HGB: 14.8 g/dL (ref 12.5–16.0)
LYMPHOCYTE #: 0.87 10*3/uL — ABNORMAL LOW (ref 1.10–5.00)
LYMPHOCYTE %: 13 % — ABNORMAL LOW (ref 25–45)
MCH: 32.1 pg — ABNORMAL HIGH (ref 27.0–32.0)
MCHC: 32 g/dL (ref 32.0–36.0)
MCV: 100.5 fL — ABNORMAL HIGH (ref 78.0–99.0)
MONOCYTE #: 0.37 10*3/uL (ref 0.00–1.30)
MONOCYTE %: 5 % (ref 0–12)
MPV: 10.1 fL (ref 7.4–10.4)
NEUTROPHIL #: 5.52 10*3/uL (ref 1.80–8.40)
NEUTROPHIL %: 81 % — ABNORMAL HIGH (ref 40–76)
PLATELETS: 228 10*3/uL (ref 140–440)
RBC: 4.61 10*6/uL (ref 4.20–5.40)
RDW: 17.3 % — ABNORMAL HIGH (ref 11.6–14.8)
WBC: 6.9 10*3/uL (ref 4.0–10.5)

## 2022-04-16 LAB — URINE DRUG SCREEN
AMPHETAMINES URINE: POSITIVE — AB
BARBITURATES URINE: NEGATIVE
BENZODIAZEPINES URINE: NEGATIVE
CANNABINOIDS URINE: NEGATIVE
COCAINE METABOLITES URINE: NEGATIVE
METHADONE URINE: NEGATIVE
OPIATES URINE: NEGATIVE
PCP URINE: NEGATIVE

## 2022-04-16 MED ORDER — HYOSCYAMINE 0.125 MG/5 ML ORAL ELIXIR
ORAL_SOLUTION | ORAL | Status: AC
Start: 2022-04-16 — End: 2022-04-16
  Filled 2022-04-16: qty 5

## 2022-04-16 MED ORDER — LIDOCAINE HCL 2 % MUCOSAL SOLUTION
15.0000 mL | Freq: Once | Status: AC
Start: 2022-04-16 — End: 2022-04-16
  Administered 2022-04-16: 15 mL via ORAL

## 2022-04-16 MED ORDER — KETOROLAC 30 MG/ML (1 ML) INJECTION SOLUTION
30.0000 mg | INTRAMUSCULAR | Status: AC
Start: 2022-04-16 — End: 2022-04-16
  Administered 2022-04-16: 30 mg via INTRAMUSCULAR

## 2022-04-16 MED ORDER — LIDOCAINE 2 % MUCOSAL JELLY IN APPLICATOR
Status: AC
Start: 2022-04-16 — End: 2022-04-16
  Filled 2022-04-16: qty 6

## 2022-04-16 MED ORDER — HYOSCYAMINE 0.125 MG/5 ML ORAL ELIXIR
10.0000 mL | ORAL_SOLUTION | Freq: Once | ORAL | Status: AC
Start: 2022-04-16 — End: 2022-04-16
  Administered 2022-04-16: 10 mL via ORAL

## 2022-04-16 MED ORDER — ALUMINUM-MAG HYDROXIDE-SIMETHICONE 200 MG-200 MG-20 MG/5 ML ORAL SUSP
30.0000 mL | Freq: Once | ORAL | Status: AC
Start: 2022-04-16 — End: 2022-04-16
  Administered 2022-04-16: 30 mL via ORAL

## 2022-04-16 MED ORDER — ALUMINUM-MAG HYDROXIDE-SIMETHICONE 200 MG-200 MG-20 MG/5 ML ORAL SUSP
ORAL | Status: AC
Start: 2022-04-16 — End: 2022-04-16
  Filled 2022-04-16: qty 30

## 2022-04-16 MED ORDER — KETOROLAC 30 MG/ML (1 ML) INJECTION SOLUTION
INTRAMUSCULAR | Status: AC
Start: 2022-04-16 — End: 2022-04-16
  Filled 2022-04-16: qty 1

## 2022-04-18 LAB — URINE CULTURE,ROUTINE: URINE CULTURE: >100000 — AB

## 2022-04-20 NOTE — Result Encounter Note (Signed)
Re: urine culture report and order to start new rx macrobid 100 mg po biod #10---called and left message to return call to er

## 2022-04-21 NOTE — Result Encounter Note (Signed)
Called pt regarding urine culture report and need for rx. No answer, left message for pt to return call to er

## 2022-04-24 ENCOUNTER — Encounter (HOSPITAL_BASED_OUTPATIENT_CLINIC_OR_DEPARTMENT_OTHER): Payer: Self-pay | Admitting: Family

## 2022-04-24 NOTE — Result Encounter Note (Signed)
Called and left message to return call to er charge nurse to discuss lab results, called secondary contact information and left message to have pt return call to er charge nurse

## 2022-04-24 NOTE — Result Encounter Note (Signed)
Since unable to contact pt via phone regarding need for rx macrobid 100 mg po bid #20 for urine culture report per Bevely Palmer lambert, called in rx to pt's preferred pharmacy [facility] [place] and sent certified letter instructing pt on urine culture report and need for rx.

## 2022-05-08 ENCOUNTER — Ambulatory Visit (RURAL_HEALTH_CENTER): Payer: Medicare Other | Admitting: Family

## 2022-09-21 ENCOUNTER — Other Ambulatory Visit: Payer: Self-pay

## 2022-09-21 ENCOUNTER — Emergency Department
Admission: EM | Admit: 2022-09-21 | Discharge: 2022-09-21 | Disposition: A | Discharge status: Home / Self Care | Payer: Medicare Other | Attending: Emergency Medicine | Admitting: Emergency Medicine

## 2022-09-21 DIAGNOSIS — R319 Hematuria, unspecified: Secondary | ICD-10-CM | POA: Insufficient documentation

## 2022-09-21 DIAGNOSIS — I10 Essential (primary) hypertension: Secondary | ICD-10-CM | POA: Insufficient documentation

## 2022-09-21 DIAGNOSIS — E860 Dehydration: Secondary | ICD-10-CM | POA: Insufficient documentation

## 2022-09-21 DIAGNOSIS — J449 Chronic obstructive pulmonary disease, unspecified: Secondary | ICD-10-CM | POA: Insufficient documentation

## 2022-09-21 DIAGNOSIS — R531 Weakness: Secondary | ICD-10-CM | POA: Insufficient documentation

## 2022-09-21 DIAGNOSIS — I252 Old myocardial infarction: Secondary | ICD-10-CM | POA: Insufficient documentation

## 2022-09-21 DIAGNOSIS — F17210 Nicotine dependence, cigarettes, uncomplicated: Secondary | ICD-10-CM | POA: Insufficient documentation

## 2022-09-21 DIAGNOSIS — N390 Urinary tract infection, site not specified: Secondary | ICD-10-CM | POA: Insufficient documentation

## 2022-09-21 LAB — COMPREHENSIVE METABOLIC PANEL, NON-FASTING
ALBUMIN/GLOBULIN RATIO: 0.8 (ref 0.8–1.4)
ALBUMIN: 2.8 g/dL — ABNORMAL LOW (ref 3.4–5.0)
ALKALINE PHOSPHATASE: 72 U/L (ref 46–116)
ALT (SGPT): 15 U/L (ref <=78)
ANION GAP: 6 mmol/L (ref 4–13)
AST (SGOT): 22 U/L (ref 15–37)
BILIRUBIN TOTAL: 0.5 mg/dL (ref 0.2–1.0)
BUN/CREA RATIO: 18
BUN: 14 mg/dL (ref 7–18)
CALCIUM, CORRECTED: 9.6 mg/dL
CALCIUM: 8.4 mg/dL — ABNORMAL LOW (ref 8.5–10.1)
CHLORIDE: 104 mmol/L (ref 98–107)
CO2 TOTAL: 33 mmol/L — ABNORMAL HIGH (ref 21–32)
CREATININE: 0.8 mg/dL (ref 0.55–1.02)
ESTIMATED GFR: 84 mL/min/{1.73_m2} (ref >59)
GLOBULIN: 3.4
GLUCOSE: 120 mg/dL — ABNORMAL HIGH (ref 74–106)
OSMOLALITY, CALCULATED: 287 mOsm/kg (ref 270–290)
POTASSIUM: 3.7 mmol/L (ref 3.5–5.1)
PROTEIN TOTAL: 6.2 g/dL — ABNORMAL LOW (ref 6.4–8.2)
SODIUM: 143 mmol/L (ref 136–145)

## 2022-09-21 LAB — URINALYSIS, MACRO/MICRO
BILIRUBIN: NEGATIVE mg/dL
BLOOD: NEGATIVE mg/dL
GLUCOSE: NEGATIVE mg/dL
KETONES: NEGATIVE mg/dL
NITRITE: NEGATIVE
PH: 7 (ref 4.6–8.0)
SPECIFIC GRAVITY: 1.015 (ref 1.003–1.035)
UROBILINOGEN: 0.2 mg/dL (ref 0.2–1.0)

## 2022-09-21 LAB — PT/INR
INR: 1.15 — ABNORMAL HIGH (ref 0.88–1.10)
PROTHROMBIN TIME: 13.3 seconds — ABNORMAL HIGH (ref 9.8–12.7)

## 2022-09-21 LAB — URINALYSIS, MICROSCOPIC

## 2022-09-21 LAB — CBC WITH DIFF
BASOPHIL #: 0.02 10*3/uL (ref 0.00–0.30)
BASOPHIL %: 0 % (ref 0–3)
EOSINOPHIL #: 0.12 10*3/uL (ref 0.00–0.80)
EOSINOPHIL %: 3 % (ref 0–7)
HCT: 41.4 % (ref 37.0–47.0)
HGB: 13.5 g/dL (ref 12.5–16.0)
LYMPHOCYTE #: 1.38 10*3/uL (ref 1.10–5.00)
LYMPHOCYTE %: 31 % (ref 25–45)
MCH: 32.6 pg — ABNORMAL HIGH (ref 27.0–32.0)
MCHC: 32.7 g/dL (ref 32.0–36.0)
MCV: 99.8 fL — ABNORMAL HIGH (ref 78.0–99.0)
MONOCYTE #: 0.3 10*3/uL (ref 0.00–1.30)
MONOCYTE %: 7 % (ref 0–12)
MPV: 8.7 fL (ref 7.4–10.4)
NEUTROPHIL #: 2.59 10*3/uL (ref 1.80–8.40)
NEUTROPHIL %: 59 % (ref 40–76)
PLATELETS: 184 10*3/uL (ref 140–440)
RBC: 4.15 10*6/uL — ABNORMAL LOW (ref 4.20–5.40)
RDW: 14.7 % (ref 11.6–14.8)
WBC: 4.4 10*3/uL (ref 4.0–10.5)

## 2022-09-21 MED ORDER — SODIUM CHLORIDE 0.9 % INTRAVENOUS PIGGYBACK
INJECTION | INTRAVENOUS | Status: AC
Start: 2022-09-21 — End: 2022-09-21
  Filled 2022-09-21: qty 50

## 2022-09-21 MED ORDER — NITROFURANTOIN MONOHYDRATE/MACROCRYSTALS 100 MG CAPSULE
100.0000 mg | ORAL_CAPSULE | Freq: Two times a day (BID) | ORAL | 0 refills | Status: AC
Start: 2022-09-21 — End: 2022-10-01

## 2022-09-21 MED ORDER — SODIUM CHLORIDE 0.9 % IV BOLUS
1000.0000 mL | INJECTION | Status: AC
Start: 2022-09-21 — End: 2022-09-22
  Administered 2022-09-21: 1000 mL via INTRAVENOUS
  Administered 2022-09-22: 0 mL via INTRAVENOUS

## 2022-09-21 MED ORDER — CEFTRIAXONE 2 GRAM SOLUTION FOR INJECTION
INTRAMUSCULAR | Status: AC
Start: 2022-09-21 — End: 2022-09-21
  Filled 2022-09-21: qty 20

## 2022-09-21 MED ORDER — SODIUM CHLORIDE 0.9 % INTRAVENOUS PIGGYBACK
2.0000 g | INTRAVENOUS | Status: AC
Start: 2022-09-21 — End: 2022-09-21
  Administered 2022-09-21: 2 g via INTRAVENOUS
  Administered 2022-09-21: 0 g via INTRAVENOUS

## 2022-09-21 NOTE — ED Nurses Note (Signed)
Pt is comfortable with discharge at this time. DC instructions discussed with verbal teach back to confirm understanding. Pt dc'd to home with family via ambulatory.

## 2022-09-21 NOTE — ED Attending Note (Addendum)
[place] emergency department         HISTORY OF PRESENT ILLNESS     Date:  09/21/2022  Patient's Name:  Brianna Mcgrath  Date of Birth:  24-Nov-1959    Patient was brought to the emergency room by EMS.  Complaint of generalized weakness probable urinary tract infection.  Patient has a history of COPD previous drug use myocardial infarction and hypertension.  Patient awake and alert on arrival to the emergency room no neurological deficits on exam.  Blood sugar 241.        Review of Systems     Review of Systems   HENT: Negative.     Respiratory: Negative.     Gastrointestinal: Negative.    Neurological:  Positive for dizziness.   Psychiatric/Behavioral:  Positive for decreased concentration.    All other systems reviewed and are negative.      Previous History     Past Medical History:  Past Medical History:   Diagnosis Date    Chronic hypoxemic respiratory failure (CMS HCC)     Congestive heart failure (CMS HCC)     COPD (chronic obstructive pulmonary disease) (CMS HCC)     Drug abuse (CMS HCC)     HTN (hypertension)     Myocardial infarction (CMS HCC)        Past Surgical History:  Past Surgical History:   Procedure Laterality Date    Hx hysterectomy         Social History:  Social History     Tobacco Use    Smoking status: Every Day     Packs/day: 1     Types: Cigarettes     Passive exposure: Current    Smokeless tobacco: Never   Substance Use Topics    Alcohol use: Not Currently    Drug use: Yes     Types: Amphetamine     Comment: Patient denies however UDS positive     Social History     Substance and Sexual Activity   Drug Use Yes    Types: Amphetamine    Comment: Patient denies however UDS positive       Family History:  Family History   Problem Relation Age of Onset    Heart Attack Other     Lung Cancer Other        Medication History:  Current Outpatient Medications   Medication Sig    albuterol sulfate 90 mcg/Actuation Inhalation HFA Aerosol Inhaler oral inhaler  Take 1-2 Puffs by inhalation Every 6 hours as needed    Amlodipine-Benazepril 10-40 mg Oral Capsule Take 1 Capsule by mouth Twice daily    atorvastatin (LIPITOR) 40 mg Oral Tablet Take 1 Tablet (40 mg total) by mouth Every evening    budesonide-formoteroL (SYMBICORT) 160-4.5 mcg/actuation Inhalation oral inhaler Take 2 Puffs by inhalation Twice daily    buprenorphine-naloxone (SUBOXONE) 8-2 mg Sublingual Tablet, Sublingual Place 1 Tablet under the tongue Three times a day as needed    clopidogreL (PLAVIX) 75 mg Oral Tablet Take 1 Tablet (75 mg total) by mouth Once a day    lisdexamfetamine (VYVANSE) 70 mg Oral Capsule Take 1 Capsule (70 mg total) by mouth Once a day    nitrofurantoin monohyd/m-cryst (MACROBID) 100 mg Oral Capsule Take 1 Capsule (100 mg total) by mouth Twice daily for 10 days    pregabalin (LYRICA) 200 mg Oral Capsule Take 1 Capsule (200 mg total) by mouth One time  QUEtiapine (SEROQUEL) 25 mg Oral Tablet Take 1 Tablet (25 mg total) by mouth Every night    TRELEGY ELLIPTA 100-62.5-25 mcg Inhalation Disk with Device TAKE 1 PUFF BY MOUTH EVERY DAY       Allergies:  Allergies   Allergen Reactions    Aspirin      GI Upset          Physical Exam     Vitals:    BP 134/79   Pulse 85   Temp (!) 35.8 C (96.5 F)   Resp 16   SpO2 99%           Physical Exam  Vitals and nursing note reviewed.   Constitutional:       General: She is not in acute distress.     Appearance: Normal appearance. She is well-developed.   HENT:      Head: Normocephalic and atraumatic.      Right Ear: Tympanic membrane normal.      Left Ear: Tympanic membrane normal.      Nose: Nose normal.      Mouth/Throat:      Mouth: Mucous membranes are dry.   Eyes:      Conjunctiva/sclera: Conjunctivae normal.      Pupils: Pupils are equal, round, and reactive to light.   Cardiovascular:      Rate and Rhythm: Normal rate and regular rhythm.      Heart sounds: No murmur heard.  Pulmonary:      Effort: Pulmonary effort is normal. No  respiratory distress.      Breath sounds: Normal breath sounds.   Abdominal:      General: Bowel sounds are normal.      Palpations: Abdomen is soft.      Tenderness: There is no abdominal tenderness.   Musculoskeletal:         General: No swelling.      Cervical back: Normal range of motion and neck supple.   Skin:     General: Skin is warm and dry.      Capillary Refill: Capillary refill takes less than 2 seconds.   Neurological:      Mental Status: She is alert.   Psychiatric:         Mood and Affect: Mood normal.         Diagnostic Studies/Treatment     Medications:  Medications Administered in the ED   NS bolus infusion 1,000 mL (1,000 mL Intravenous New Bag/New Syringe 09/21/22 2029)   cefTRIAXone (ROCEPHIN) 2 g in NS 50 mL IVPB minibag (2 g Intravenous New Bag/New Syringe 09/21/22 2102)       New Prescriptions    NITROFURANTOIN MONOHYD/M-CRYST (MACROBID) 100 MG ORAL CAPSULE    Take 1 Capsule (100 mg total) by mouth Twice daily for 10 days       Labs:    Results for orders placed or performed during the hospital encounter of 09/21/22 (from the past 12 hour(s))   COMPREHENSIVE METABOLIC PANEL, NON-FASTING   Result Value Ref Range    SODIUM 143 136 - 145 mmol/L    POTASSIUM 3.7 3.5 - 5.1 mmol/L    CHLORIDE 104 98 - 107 mmol/L    CO2 TOTAL 33 (H) 21 - 32 mmol/L    ANION GAP 6 4 - 13 mmol/L    BUN 14 7 - 18 mg/dL    CREATININE 0.80 0.55 - 1.02 mg/dL    BUN/CREA RATIO 18     ESTIMATED GFR  84 >59 mL/min/1.24m^2    ALBUMIN 2.8 (L) 3.4 - 5.0 g/dL    CALCIUM 8.4 (L) 8.5 - 10.1 mg/dL    GLUCOSE 120 (H) 74 - 106 mg/dL    ALKALINE PHOSPHATASE 72 46 - 116 U/L    ALT (SGPT) 15 <=78 U/L    AST (SGOT) 22 15 - 37 U/L    BILIRUBIN TOTAL 0.5 0.2 - 1.0 mg/dL    PROTEIN TOTAL 6.2 (L) 6.4 - 8.2 g/dL    ALBUMIN/GLOBULIN RATIO 0.8 0.8 - 1.4    OSMOLALITY, CALCULATED 287 270 - 290 mOsm/kg    CALCIUM, CORRECTED 9.6 mg/dL    GLOBULIN 3.4    PT/INR   Result Value Ref Range    PROTHROMBIN TIME 13.3 (H) 9.8 - 12.7 seconds    INR 1.15 (H)  0.88 - 1.10   CBC WITH DIFF   Result Value Ref Range    WBC 4.4 4.0 - 10.5 x10^3/uL    RBC 4.15 (L) 4.20 - 5.40 x10^6/uL    HGB 13.5 12.5 - 16.0 g/dL    HCT 41.4 37.0 - 47.0 %    MCV 99.8 (H) 78.0 - 99.0 fL    MCH 32.6 (H) 27.0 - 32.0 pg    MCHC 32.7 32.0 - 36.0 g/dL    RDW 14.7 11.6 - 14.8 %    PLATELETS 184 140 - 440 x10^3/uL    MPV 8.7 7.4 - 10.4 fL    NEUTROPHIL % 59 40 - 76 %    LYMPHOCYTE % 31 25 - 45 %    MONOCYTE % 7 0 - 12 %    EOSINOPHIL % 3 0 - 7 %    BASOPHIL % 0 0 - 3 %    NEUTROPHIL # 2.59 1.80 - 8.40 x10^3/uL    LYMPHOCYTE # 1.38 1.10 - 5.00 x10^3/uL    MONOCYTE # 0.30 0.00 - 1.30 x10^3/uL    EOSINOPHIL # 0.12 0.00 - 0.80 x10^3/uL    BASOPHIL # 0.02 0.00 - 0.30 x10^3/uL   URINALYSIS, MACRO/MICRO   Result Value Ref Range    COLOR Light Yellow (A) Yellow    APPEARANCE Clear Clear    SPECIFIC GRAVITY 1.015 1.003 - 1.035    PH 7.0 4.6 - 8.0    LEUKOCYTES Small (A) Negative WBCs/uL    NITRITE Negative Negative    PROTEIN Trace (A) Negative mg/dL    GLUCOSE Negative Negative mg/dL    KETONES Negative Negative mg/dL    BILIRUBIN Negative Negative mg/dL    BLOOD Negative Negative mg/dL    UROBILINOGEN 0.2 0.2 - 1.0 mg/dL   URINALYSIS, MICROSCOPIC   Result Value Ref Range    RBCS 0-3 0-3, Not Present /hpf    BACTERIA Moderate (A) Negative /hpf    MUCOUS Moderate (A) (none) /hpf    WBCS 16-20 (A) Not Present, Occasional, 0-5 /hpf    SQUAMOUS EPITHELIAL Several (A) Not Present, Few /hpf        Radiology:  None    No orders to display       ECG:  NONE            Differential diagnosis  1. Hyperglycemia, urinary tract infection, dehydration     Course/Disposition/Plan     Course:     ED Course as of 09/21/22 2131   Sat Sep 21, 2022   2130 PATIENT TREATED WITH IV ROCEPHIN FOR URINARY TRACT INFECTION.  PATIENT IN NO DISTRESS WILL BE DISCHARGED HOME  Patient is a 61 year old with generalized weakness confusion tonight no awake and alert in the ER positive for UTI will be started on IV fluids IV  antibiotics    Disposition:    Discharged    Condition at Disposition:   Stable    Follow up:   PRIMARY PHYSICIAN    Schedule an appointment as soon as possible for a visit in 3 days  If symptoms worsen      Clinical Impression:     Clinical Impression   Generalized weakness (Primary)   Urinary tract infection with hematuria, site unspecified   Dehydration         Winfred Burn, MD

## 2022-09-21 NOTE — ED Triage Notes (Signed)
EMS reports poor appettite, generalized weakness, poss UTI X 2-3 days . Pt Hx of COPD, Chronically on 02 . VSS en route. BS "HI" for EMS

## 2022-09-24 LAB — URINE CULTURE,ROUTINE: URINE CULTURE: 5000 — AB
